# Patient Record
Sex: Female | Born: 1960 | Race: White | Hispanic: No | Marital: Married | Smoking: Never smoker
Health system: Southern US, Community
[De-identification: ages and names within clinical notes are randomized; demographics above are authoritative.]

## PROBLEM LIST (undated history)

## (undated) DIAGNOSIS — E063 Autoimmune thyroiditis: Secondary | ICD-10-CM

## (undated) DIAGNOSIS — Z972 Presence of dental prosthetic device (complete) (partial): Secondary | ICD-10-CM

## (undated) DIAGNOSIS — F32A Depression, unspecified: Secondary | ICD-10-CM

## (undated) DIAGNOSIS — G43909 Migraine, unspecified, not intractable, without status migrainosus: Secondary | ICD-10-CM

## (undated) DIAGNOSIS — E559 Vitamin D deficiency, unspecified: Secondary | ICD-10-CM

## (undated) DIAGNOSIS — N182 Chronic kidney disease, stage 2 (mild): Secondary | ICD-10-CM

## (undated) DIAGNOSIS — E785 Hyperlipidemia, unspecified: Secondary | ICD-10-CM

## (undated) DIAGNOSIS — R112 Nausea with vomiting, unspecified: Secondary | ICD-10-CM

## (undated) DIAGNOSIS — Z9889 Other specified postprocedural states: Secondary | ICD-10-CM

## (undated) DIAGNOSIS — E669 Obesity, unspecified: Secondary | ICD-10-CM

## (undated) DIAGNOSIS — T7840XA Allergy, unspecified, initial encounter: Secondary | ICD-10-CM

## (undated) DIAGNOSIS — F329 Major depressive disorder, single episode, unspecified: Secondary | ICD-10-CM

## (undated) HISTORY — DX: Vitamin D deficiency, unspecified: E55.9

## (undated) HISTORY — DX: Obesity, unspecified: E66.9

## (undated) HISTORY — DX: Allergy, unspecified, initial encounter: T78.40XA

## (undated) HISTORY — DX: Chronic kidney disease, stage 2 (mild): N18.2

## (undated) HISTORY — DX: Hyperlipidemia, unspecified: E78.5

## (undated) HISTORY — DX: Depression, unspecified: F32.A

## (undated) HISTORY — DX: Autoimmune thyroiditis: E06.3

## (undated) HISTORY — DX: Migraine, unspecified, not intractable, without status migrainosus: G43.909

## (undated) HISTORY — DX: Major depressive disorder, single episode, unspecified: F32.9

---

## 2004-05-26 ENCOUNTER — Emergency Department: Payer: Self-pay | Admitting: Emergency Medicine

## 2004-05-26 ENCOUNTER — Other Ambulatory Visit: Payer: Self-pay

## 2004-09-16 ENCOUNTER — Ambulatory Visit: Payer: Self-pay | Admitting: Obstetrics and Gynecology

## 2005-09-25 HISTORY — PX: ABDOMINAL HYSTERECTOMY: SHX81

## 2005-09-26 ENCOUNTER — Ambulatory Visit: Payer: Self-pay | Admitting: Obstetrics and Gynecology

## 2005-11-17 ENCOUNTER — Ambulatory Visit: Payer: Self-pay | Admitting: Obstetrics and Gynecology

## 2010-07-15 ENCOUNTER — Ambulatory Visit: Payer: Self-pay | Admitting: Family Medicine

## 2010-07-26 ENCOUNTER — Ambulatory Visit: Payer: Self-pay | Admitting: Family Medicine

## 2011-09-26 ENCOUNTER — Ambulatory Visit: Payer: Self-pay | Admitting: Family Medicine

## 2013-08-12 LAB — HM PAP SMEAR: HM Pap smear: NEGATIVE

## 2013-10-04 ENCOUNTER — Ambulatory Visit: Payer: Self-pay | Admitting: Family Medicine

## 2013-10-04 LAB — HM MAMMOGRAPHY

## 2014-08-26 DIAGNOSIS — E559 Vitamin D deficiency, unspecified: Secondary | ICD-10-CM

## 2014-08-26 HISTORY — DX: Vitamin D deficiency, unspecified: E55.9

## 2015-01-08 ENCOUNTER — Other Ambulatory Visit: Payer: Self-pay | Admitting: Family Medicine

## 2015-01-08 MED ORDER — BUPROPION HCL ER (SR) 100 MG PO TB12: 100.0000 mg | ORAL_TABLET | Freq: Two times a day (BID) | ORAL | Status: DC

## 2015-01-08 NOTE — Telephone Encounter (Signed)
Pt called stated she needs a refill on the following:  Wellbutrin  Pharm: [facility]   Thanks.

## 2015-01-08 NOTE — Telephone Encounter (Signed)
Routing to provider  

## 2015-03-05 ENCOUNTER — Other Ambulatory Visit: Payer: Self-pay | Admitting: Family Medicine

## 2015-03-06 ENCOUNTER — Other Ambulatory Visit: Payer: Self-pay

## 2015-03-06 MED ORDER — BUPROPION HCL ER (SR) 100 MG PO TB12: 100.0000 mg | ORAL_TABLET | Freq: Two times a day (BID) | ORAL | Status: DC

## 2015-03-06 NOTE — Telephone Encounter (Signed)
She needs a new rx for Wellbutrin, has a new mail order company.

## 2015-03-06 NOTE — Telephone Encounter (Signed)
Needs new rxs for new mail order pharmacy.

## 2015-03-07 MED ORDER — SERTRALINE HCL 100 MG PO TABS: 100.0000 mg | ORAL_TABLET | Freq: Every day | ORAL | Status: DC

## 2015-03-07 MED ORDER — LISINOPRIL 2.5 MG PO TABS: 2.5000 mg | ORAL_TABLET | Freq: Every day | ORAL | Status: DC

## 2015-04-09 DIAGNOSIS — N183 Chronic kidney disease, stage 3 unspecified: Secondary | ICD-10-CM | POA: Insufficient documentation

## 2015-04-09 DIAGNOSIS — E559 Vitamin D deficiency, unspecified: Secondary | ICD-10-CM | POA: Insufficient documentation

## 2015-04-09 DIAGNOSIS — N182 Chronic kidney disease, stage 2 (mild): Secondary | ICD-10-CM | POA: Insufficient documentation

## 2015-04-09 DIAGNOSIS — F329 Major depressive disorder, single episode, unspecified: Secondary | ICD-10-CM | POA: Insufficient documentation

## 2015-04-09 DIAGNOSIS — G43909 Migraine, unspecified, not intractable, without status migrainosus: Secondary | ICD-10-CM | POA: Insufficient documentation

## 2015-04-09 DIAGNOSIS — F39 Unspecified mood [affective] disorder: Secondary | ICD-10-CM | POA: Insufficient documentation

## 2015-04-09 DIAGNOSIS — F32A Depression, unspecified: Secondary | ICD-10-CM | POA: Insufficient documentation

## 2015-04-09 DIAGNOSIS — E785 Hyperlipidemia, unspecified: Secondary | ICD-10-CM | POA: Insufficient documentation

## 2015-04-09 DIAGNOSIS — E063 Autoimmune thyroiditis: Secondary | ICD-10-CM | POA: Insufficient documentation

## 2015-04-16 ENCOUNTER — Ambulatory Visit (INDEPENDENT_AMBULATORY_CARE_PROVIDER_SITE_OTHER): Payer: 59 | Admitting: Family Medicine

## 2015-04-16 ENCOUNTER — Encounter: Payer: Self-pay | Admitting: Family Medicine

## 2015-04-16 VITALS — BP 113/71 | HR 79 | Temp 98.5°F | Ht 65.25 in | Wt 166.2 lb

## 2015-04-16 DIAGNOSIS — N182 Chronic kidney disease, stage 2 (mild): Secondary | ICD-10-CM

## 2015-04-16 DIAGNOSIS — L821 Other seborrheic keratosis: Secondary | ICD-10-CM | POA: Insufficient documentation

## 2015-04-16 DIAGNOSIS — E785 Hyperlipidemia, unspecified: Secondary | ICD-10-CM

## 2015-04-16 DIAGNOSIS — E559 Vitamin D deficiency, unspecified: Secondary | ICD-10-CM | POA: Diagnosis not present

## 2015-04-16 DIAGNOSIS — F32A Depression, unspecified: Secondary | ICD-10-CM

## 2015-04-16 DIAGNOSIS — F329 Major depressive disorder, single episode, unspecified: Secondary | ICD-10-CM | POA: Diagnosis not present

## 2015-04-16 DIAGNOSIS — G43109 Migraine with aura, not intractable, without status migrainosus: Secondary | ICD-10-CM

## 2015-04-16 DIAGNOSIS — E063 Autoimmune thyroiditis: Secondary | ICD-10-CM

## 2015-04-16 NOTE — Progress Notes (Signed)
BP 113/71 mmHg  Pulse 79  Temp(Src) 98.5 F (36.9 C)  Ht 5' 5.25" (1.657 m)  Wt 166 lb 3.2 oz (75.388 kg)  BMI 27.46 kg/m2  SpO2 98%   Subjective:    Patient ID: Patty Young, female    DOB: Jul 18, 1960, 54 y.o.   MRN: [id number]  HPI: Patty Young is a 54 y.o. female  Chief Complaint  Patient presents with  . Follow-up    6 month follow up. Garment/textile technologist. [id number]   She had high cholesterol; she was not aware of this and we reviewed old labs; she has been working on weight loss and healthier eating; eats 2-3 eggs a week; occasional processed pork, not every day; does not drink milk; previous LDL when heavier was 155, and she brought this down to 118 on her own without medicine in March of 2016  She has hashimoto's; has lost weight on purpose; years ago, she was seen by Dr. Dossie Arbour, had abnormal labs, then went to see endocrinologist, had radiation (?) and thyroid testing, then was told she had nothing else wrong and the endocrinologist didn't know why she was there; this all happened years ago and she has been fine since; she denies loose stools or constipation; she has obesity, but is working on losing weight on purpose; Garment/textile technologist reviewed; she was 184 pounds last August, then 170 pounds, now down to 166 pounds; no set goal  She has CKD stage 2; GFR over 60 (69 last time) and has been relatively stable since 2010; she now takes the ACE-I, low dose  Migraines; one imitrex this month; gets the aura  Mood is doing well; she wants to stay on the same medicine; denies SI/HI  She has low vitamin D; she took the Rx for two months; does not spend much time outside at all; sun does like her, face breaks out, gets fever blisters and ulcers really bad; check that today  Past Medical History  Diagnosis Date  . Depression   . Hyperlipidemia   . Migraines   . CKD (chronic kidney disease) stage 2, GFR 60-89 ml/min   . Obesity   . Hashimoto's disease   . Vitamin D deficiency  disease March 2016    11.7   Past Surgical History  Procedure Laterality Date  . Abdominal hysterectomy  09/2005    still has ovaries and cervix, needs paps   Relevant past medical, surgical, family and social history reviewed and updated as indicated. Interim medical history since our last visit reviewed. Allergies and medications reviewed and updated.  Review of Systems Per HPI unless specifically indicated above     Objective:    BP 113/71 mmHg  Pulse 79  Temp(Src) 98.5 F (36.9 C)  Ht 5' 5.25" (1.657 m)  Wt 166 lb 3.2 oz (75.388 kg)  BMI 27.46 kg/m2  SpO2 98%  Wt Readings from Last 3 Encounters:  04/16/15 166 lb 3.2 oz (75.388 kg)  09/05/14 170 lb (77.111 kg)    Physical Exam  Constitutional: She appears well-developed and well-nourished. No distress.  Weight down 4 pounds over last 7 months  HENT:  Head: Normocephalic and atraumatic.  Eyes: EOM are normal. No scleral icterus.  Neck: No thyromegaly present.  Cardiovascular: Normal rate, regular rhythm and normal heart sounds.   No murmur heard. Pulmonary/Chest: Effort normal and breath sounds normal. No respiratory distress. She has no wheezes.  Abdominal: Soft. Bowel sounds are normal. She exhibits no distension.  Musculoskeletal: Normal range  of motion. She exhibits no edema.  Neurological: She is alert. She exhibits normal muscle tone.  Skin: Skin is warm and dry. She is not diaphoretic. No pallor.  Light brown nummular lesion, very slightly papular, keratotic on the posterior left calf; few other similar lesions on legs; tiny cherry angioma on the left side chest  Psychiatric: She has a normal mood and affect. Her behavior is normal. Judgment and thought content normal.    Results for orders placed or performed in visit on 04/09/15  HM MAMMOGRAPHY  Result Value Ref Range   HM Mammogram per PP   HM PAP SMEAR  Result Value Ref Range   HM Pap smear normal,HPV Negative       Assessment & Plan:   Problem  List Items Addressed This Visit      Cardiovascular and Mediastinum   Migraines    Avoid triggers; use of triptan occasionally      Relevant Medications   SUMAtriptan (IMITREX) 100 MG tablet     Endocrine   Hashimoto's disease    Reviewed TSH back to 2006, saw specialist, asymptomatic; check TSH yearly or if new sx        Musculoskeletal and Integument   Seborrheic keratoses    Explained benign lesion on the posterior left calf        Genitourinary   CKD (chronic kidney disease) stage 2, GFR 60-89 ml/min - Primary    Check on ACE-I, expect number to stay stable or improve; avoid NSAIDs and stay hydrated      Relevant Orders   Basic metabolic panel     Other   Depression    Continue medicine; no SI/HI      Hyperlipidemia    This has resolved with patient watching her diet and losing weight; check once a year      Vitamin D deficiency disease    I was going to check vit D level but it costs $200 and we'll just have her take the OTC vitamin D3         Follow up plan: Return in about 6 months (around 10/15/2015) for follow-up, fasting labs; physical and pap when due.  An after-visit summary was printed and given to the patient at check-out.  Please see the patient instructions which may contain other information and recommendations beyond what is mentioned above in the assessment and plan. Orders Placed This Encounter  Procedures  . Basic metabolic panel   Face-to-face time with patient was more than 25 minutes, >50% time spent counseling and coordination of care

## 2015-04-16 NOTE — Assessment & Plan Note (Signed)
Avoid triggers; use of triptan occasionally

## 2015-04-16 NOTE — Patient Instructions (Addendum)
Please contact your health insurance carrier about preventive services and if they will not cover things like flu shots and mammograms, please report that or give me a call If you need something for aches or pains, try to use Tylenol (acetaminophen) instead of non-steroidals (which include Aleve, ibuprofen, Advil, Motrin, and naproxen); non-steroidals can cause long-term kidney damage Take 2,000 iu of vitamin D3 daily I do recommend yearly flu shots; for individuals who don't want flu shots, try to practice excellent hand hygiene, and avoid nursing homes, day cares, and hospitals during peak flu season; taking 1000 mg of vitamin C daily during flu/cold season may help boost your immune system too Return in 6 months and come fasting for cholesterol, etc., and return for your physical and pap smear when due

## 2015-04-16 NOTE — Assessment & Plan Note (Signed)
Explained benign lesion on the posterior left calf

## 2015-04-16 NOTE — Assessment & Plan Note (Signed)
Reviewed TSH back to 2006, saw specialist, asymptomatic; check TSH yearly or if new sx

## 2015-04-16 NOTE — Assessment & Plan Note (Signed)
Continue medicine; no SI/HI

## 2015-04-16 NOTE — Assessment & Plan Note (Signed)
Check on ACE-I, expect number to stay stable or improve; avoid NSAIDs and stay hydrated

## 2015-04-16 NOTE — Assessment & Plan Note (Addendum)
I was going to check vit D level but it costs $200 and we'll just have her take the OTC vitamin D3

## 2015-04-16 NOTE — Assessment & Plan Note (Signed)
This has resolved with patient watching her diet and losing weight; check once a year

## 2015-04-17 ENCOUNTER — Encounter: Payer: Self-pay | Admitting: Family Medicine

## 2015-04-17 LAB — BASIC METABOLIC PANEL
BUN / CREAT RATIO: 12 (ref 9–23)
BUN: 11 mg/dL (ref 6–24)
CALCIUM: 9.2 mg/dL (ref 8.7–10.2)
CHLORIDE: 99 mmol/L (ref 97–106)
CO2: 26 mmol/L (ref 18–29)
Creatinine, Ser: 0.89 mg/dL (ref 0.57–1.00)
GFR calc Af Amer: 86 mL/min/{1.73_m2} (ref >59)
GFR calc non Af Amer: 75 mL/min/{1.73_m2} (ref >59)
GLUCOSE: 77 mg/dL (ref 65–99)
Potassium: 4.3 mmol/L (ref 3.5–5.2)
Sodium: 140 mmol/L (ref 136–144)

## 2015-06-17 ENCOUNTER — Encounter: Payer: Self-pay | Admitting: Family Medicine

## 2015-06-18 MED ORDER — VALACYCLOVIR HCL 1 G PO TABS: ORAL_TABLET | ORAL | Status: DC

## 2015-09-07 ENCOUNTER — Encounter: Payer: Self-pay | Admitting: Family Medicine

## 2015-09-07 ENCOUNTER — Ambulatory Visit (INDEPENDENT_AMBULATORY_CARE_PROVIDER_SITE_OTHER): Payer: 59 | Admitting: Family Medicine

## 2015-09-07 VITALS — BP 110/78 | HR 81 | Temp 97.7°F | Ht 66.0 in

## 2015-09-07 DIAGNOSIS — N182 Chronic kidney disease, stage 2 (mild): Secondary | ICD-10-CM

## 2015-09-07 DIAGNOSIS — E785 Hyperlipidemia, unspecified: Secondary | ICD-10-CM

## 2015-09-07 DIAGNOSIS — F32A Depression, unspecified: Secondary | ICD-10-CM

## 2015-09-07 DIAGNOSIS — G43109 Migraine with aura, not intractable, without status migrainosus: Secondary | ICD-10-CM

## 2015-09-07 DIAGNOSIS — E063 Autoimmune thyroiditis: Secondary | ICD-10-CM

## 2015-09-07 DIAGNOSIS — F329 Major depressive disorder, single episode, unspecified: Secondary | ICD-10-CM | POA: Diagnosis not present

## 2015-09-07 NOTE — Assessment & Plan Note (Signed)
Asymptomatic. Continue sertraline and bupropion.

## 2015-09-07 NOTE — Progress Notes (Signed)
Patient ID: Patty Young, female   DOB: Feb 02, 1961, 55 y.o.   MRN: [id number]  Patty AlarEric Clide Remmers, MD Phone: [phone]  Patty Young is a 55 y.o. female who presents today for new patient visit.  Patient presents to establish care. She reports no complaints today. We have reviewed her past medical history. She has been advised she had hyperlipidemia past, though worked on her diet by cutting out sugars and lost 20 pounds. On review of her last office visit note at her prior PCPs office it appears that this has returned into the normal range. Chronic kidney disease stage II, patient reports that she was advised that she had kidney issues and she thinks this was around the time that she was using a significant amount of ibuprofen for headaches. She's been taking lisinopril 2.5 mg daily. Her last GFR was 75. Creatinine was 0.89. She uses minimal NSAIDs at this time. Depression, patient reports being stable on sertraline and bupropion. No issues with depression or anxiety at this time. No SI or HI. Migraine headaches, patient notes having these for most of her life. They started in the back on one side and come up to the front. There is associated photophobia and phonophobia. She's been taking Imitrex 0-2 times per month for headaches. Headaches have not changed in some time. Last headache was several weeks ago. Denies numbness, weakness, and vision changes. Does note a family history of migraine headaches. Hashimoto's, reports a history of this in the past. Was evaluated by endocrinology and was told that her thyroid was normal. She denies symptoms related to her thyroid. No temperature intolerance, weight gain or weight loss, palpitations, or sweatiness.  Active Ambulatory Problems    Diagnosis Date Noted  . Depression   . Hyperlipidemia   . Migraines   . CKD (chronic kidney disease) stage 2, GFR 60-89 ml/min   . Hashimoto's disease   . Vitamin D deficiency disease   . Seborrheic keratoses  04/16/2015   Resolved Ambulatory Problems    Diagnosis Date Noted  . No Resolved Ambulatory Problems   Past Medical History  Diagnosis Date  . Obesity     Family History  Problem Relation Age of Onset  . COPD Mother   . Hypertension Mother   . Osteoporosis Mother   . Hypertension Father   . Hyperlipidemia Brother     Social History   Social History  . Marital Status: Married    Spouse Name: N/A  . Number of Children: N/A  . Years of Education: N/A   Occupational History  . Not on file.   Social History Main Topics  . Smoking status: Never Smoker   . Smokeless tobacco: Never Used  . Alcohol Use: No  . Drug Use: No  . Sexual Activity: Not on file   Other Topics Concern  . Not on file   Social History Narrative    ROS  General:  Negative for nexplained weight loss, fever Skin: Negative for new or changing mole, sore that won't heal HEENT: Negative for trouble hearing, trouble seeing, ringing in ears, mouth sores, hoarseness, change in voice, dysphagia. CV:  Negative for chest pain, dyspnea, edema, palpitations Resp: Negative for cough, dyspnea, hemoptysis GI: Negative for nausea, vomiting, diarrhea, constipation, abdominal pain, melena, hematochezia. GU: Negative for dysuria, incontinence, urinary hesitance, hematuria, vaginal or penile discharge, polyuria, sexual difficulty, lumps in testicle or breasts MSK: Negative for muscle cramps or aches, joint pain or swelling Neuro: Negative for headaches, weakness, numbness, dizziness,  passing out/fainting Psych: Negative for depression, anxiety, memory problems  Objective  Physical Exam Filed Vitals:   09/07/15 0816  BP: 110/78  Pulse: 81  Temp: 97.7 F (36.5 C)    BP Readings from Last 3 Encounters:  09/07/15 110/78  04/16/15 113/71  09/05/14 118/74   Wt Readings from Last 3 Encounters:  04/16/15 166 lb 3.2 oz (75.388 kg)  09/05/14 170 lb (77.111 kg)    Physical Exam  Constitutional: She is  well-developed, well-nourished, and in no distress.  HENT:  Head: Normocephalic and atraumatic.  Right Ear: External ear normal.  Left Ear: External ear normal.  Mouth/Throat: Oropharynx is clear and moist. No oropharyngeal exudate.  Eyes: Conjunctivae are normal. Pupils are equal, round, and reactive to light.  Neck: Neck supple.  Cardiovascular: Normal rate, regular rhythm and normal heart sounds.  Exam reveals no gallop and no friction rub.   No murmur heard. Pulmonary/Chest: Effort normal and breath sounds normal. No respiratory distress. She has no wheezes. She has no rales.  Abdominal: Soft. Bowel sounds are normal. She exhibits no distension. There is no tenderness. There is no rebound and no guarding.  Musculoskeletal: She exhibits no edema.  Lymphadenopathy:    She has no cervical adenopathy.  Neurological: She is alert. Gait normal.  Moves all extremities equally  Skin: Skin is warm and dry. She is not diaphoretic.  Psychiatric: Mood and affect normal.     Assessment/Plan:   CKD (chronic kidney disease) stage 2, GFR 60-89 ml/min BMP checked 5 months ago. Limit NSAID use. We'll continue to monitor and recheck in a month.  Depression Asymptomatic. Continue sertraline and bupropion.   Hashimoto's disease Asymptomatic. We'll recheck a TSH in one month.  Hyperlipidemia Reports this issue is resolved with diet and exercise. We'll recheck a lipid panel at her next office visit.  Migraines Stable. Asymptomatic at this time. Continue Imitrex as needed. Given return precautions.   Patty Alar, MD [facility]

## 2015-09-07 NOTE — Progress Notes (Signed)
Pre visit review using our clinic review tool, if applicable. No additional management support is needed unless otherwise documented below in the visit note. 

## 2015-09-07 NOTE — Assessment & Plan Note (Signed)
Stable. Asymptomatic at this time. Continue Imitrex as needed. Given return precautions.

## 2015-09-07 NOTE — Assessment & Plan Note (Signed)
Asymptomatic. We'll recheck a TSH in one month.

## 2015-09-07 NOTE — Assessment & Plan Note (Signed)
BMP checked 5 months ago. Limit NSAID use. We'll continue to monitor and recheck in a month.

## 2015-09-07 NOTE — Assessment & Plan Note (Signed)
Reports this issue is resolved with diet and exercise. We'll recheck a lipid panel at her next office visit.

## 2015-09-07 NOTE — Patient Instructions (Signed)
Nice to meet you. We will request records from your prior PCP. Please continue your current medication regimen. We will see back in a month for lab work.

## 2015-10-12 ENCOUNTER — Ambulatory Visit (INDEPENDENT_AMBULATORY_CARE_PROVIDER_SITE_OTHER): Payer: 59 | Admitting: Family Medicine

## 2015-10-12 ENCOUNTER — Encounter: Payer: Self-pay | Admitting: Family Medicine

## 2015-10-12 VITALS — BP 114/76 | HR 84 | Temp 98.3°F | Ht 66.0 in | Wt 172.6 lb

## 2015-10-12 DIAGNOSIS — Z1239 Encounter for other screening for malignant neoplasm of breast: Secondary | ICD-10-CM

## 2015-10-12 DIAGNOSIS — N182 Chronic kidney disease, stage 2 (mild): Secondary | ICD-10-CM | POA: Diagnosis not present

## 2015-10-12 DIAGNOSIS — Z8639 Personal history of other endocrine, nutritional and metabolic disease: Secondary | ICD-10-CM | POA: Diagnosis not present

## 2015-10-12 DIAGNOSIS — E785 Hyperlipidemia, unspecified: Secondary | ICD-10-CM | POA: Diagnosis not present

## 2015-10-12 DIAGNOSIS — E063 Autoimmune thyroiditis: Secondary | ICD-10-CM

## 2015-10-12 LAB — COMPREHENSIVE METABOLIC PANEL
ALK PHOS: 58 U/L (ref 39–117)
ALT: 16 U/L (ref 0–35)
AST: 16 U/L (ref 0–37)
Albumin: 4.3 g/dL (ref 3.5–5.2)
BUN: 16 mg/dL (ref 6–23)
CO2: 26 mEq/L (ref 19–32)
Calcium: 9.6 mg/dL (ref 8.4–10.5)
Chloride: 102 mEq/L (ref 96–112)
Creatinine, Ser: 1.01 mg/dL (ref 0.40–1.20)
GFR: 61.02 mL/min (ref >60.00)
Glucose, Bld: 79 mg/dL (ref 70–99)
POTASSIUM: 4.7 meq/L (ref 3.5–5.1)
SODIUM: 138 meq/L (ref 135–145)
TOTAL PROTEIN: 7.6 g/dL (ref 6.0–8.3)
Total Bilirubin: 0.4 mg/dL (ref 0.2–1.2)

## 2015-10-12 LAB — LIPID PANEL
Cholesterol: 219 mg/dL — ABNORMAL HIGH (ref 0–200)
HDL: 62.6 mg/dL (ref >39.00)
LDL CALC: 144 mg/dL — AB (ref 0–99)
NONHDL: 156.52
Total CHOL/HDL Ratio: 4
Triglycerides: 61 mg/dL (ref 0.0–149.0)
VLDL: 12.2 mg/dL (ref 0.0–40.0)

## 2015-10-12 LAB — TSH: TSH: 2.36 u[IU]/mL (ref 0.35–4.50)

## 2015-10-12 NOTE — Progress Notes (Signed)
Pre visit review using our clinic review tool, if applicable. No additional management support is needed unless otherwise documented below in the visit note. 

## 2015-10-12 NOTE — Progress Notes (Signed)
Patient ID: Lezlie Octave, female   DOB: 1960/09/11, 55 y.o.   MRN: [id number]  Tommi Rumps, MD Phone: [phone]  Patty Young is a 55 y.o. female who presents today for follow-up.  Hashimoto's thyroiditis: Patient is a history of this in the past. Notes her recent thyroid test has been normal. No temperature intolerance, weight changes, palpitations, or sweatiness.  CKD: Patient notes she takes lisinopril 2.5 mg daily for this. Notes her creatinine has been up and down in the past and this was felt to be related to NSAID use. Takes NSAIDs infrequently now.  HYPERLIPIDEMIA Symptoms Chest pain on exertion:  No   Leg claudication:   No Diet-controlled. Has cut out sugars. Still eats fried fatty foods. Does not eat out frequently. Has not been exercising.  PMH: nonsmoker.   ROS see history of present illness  Objective  Physical Exam Filed Vitals:   10/12/15 0758  BP: 114/76  Pulse: 84  Temp: 98.3 F (36.8 C)    BP Readings from Last 3 Encounters:  10/12/15 114/76  09/07/15 110/78  04/16/15 113/71   Wt Readings from Last 3 Encounters:  10/12/15 172 lb 9.6 oz (78.291 kg)  04/16/15 166 lb 3.2 oz (75.388 kg)  09/05/14 170 lb (77.111 kg)    Physical Exam  Constitutional: She is well-developed, well-nourished, and in no distress.  HENT:  Head: Normocephalic and atraumatic.  Neck: Neck supple. No thyromegaly present.  Cardiovascular: Normal rate, regular rhythm and normal heart sounds.   Pulmonary/Chest: Effort normal and breath sounds normal.  Neurological: She is alert. Gait normal.  Skin: Skin is warm and dry. She is not diaphoretic.     Assessment/Plan: Please see individual problem list.  CKD (chronic kidney disease) stage 2, GFR 60-89 ml/min Check CMP today. Avoid NSAIDs. Continue lisinopril.  Hyperlipidemia Asymptomatic. Check lipid panel today.  Hashimoto's disease Asymptomatic. Check TSH.    Orders Placed This Encounter  Procedures  . MM  Digital Screening    Standing Status: Future     Number of Occurrences:      Standing Expiration Date: 12/11/2016    Order Specific Question:  Reason for Exam (SYMPTOM  OR DIAGNOSIS REQUIRED)    Answer:  breast cancer screening    Order Specific Question:  Is the patient pregnant?    Answer:  No    Order Specific Question:  Preferred imaging location?    Answer:  [facility]  . TSH  . Comp Met (CMET)  . Lipid Profile    # Healthcare maintenance: Mammogram ordered. Patient advised to check with insurance company to see if they cover hepatitis C and HIV screening. She was also given stool cards given that she reports they do not cover colonoscopy.   Tommi Rumps, MD [facility]

## 2015-10-12 NOTE — Assessment & Plan Note (Signed)
Asymptomatic.  Check TSH

## 2015-10-12 NOTE — Patient Instructions (Signed)
Nice to see you. We will check lab work today. Please check with your insurance to make sure they cover the stool cards, hepatitis C screening, and HIV screening.

## 2015-10-12 NOTE — Assessment & Plan Note (Signed)
Asymptomatic. Check lipid panel today.

## 2015-10-12 NOTE — Assessment & Plan Note (Signed)
Check CMP today. Avoid NSAIDs. Continue lisinopril.

## 2015-10-13 ENCOUNTER — Ambulatory Visit: Payer: 59 | Admitting: Family Medicine

## 2015-10-14 ENCOUNTER — Ambulatory Visit: Payer: 59 | Admitting: Family Medicine

## 2015-10-14 ENCOUNTER — Encounter: Payer: Self-pay | Admitting: Family Medicine

## 2015-10-15 ENCOUNTER — Ambulatory Visit: Payer: 59 | Admitting: Family Medicine

## 2015-10-20 ENCOUNTER — Encounter: Payer: Self-pay | Admitting: Family Medicine

## 2015-10-30 ENCOUNTER — Ambulatory Visit
Admission: RE | Admit: 2015-10-30 | Discharge: 2015-10-30 | Disposition: A | Discharge status: Home / Self Care | Payer: 59 | Source: Ambulatory Visit | Attending: Family Medicine | Admitting: Family Medicine

## 2015-10-30 DIAGNOSIS — Z1239 Encounter for other screening for malignant neoplasm of breast: Secondary | ICD-10-CM

## 2015-10-30 DIAGNOSIS — Z1231 Encounter for screening mammogram for malignant neoplasm of breast: Secondary | ICD-10-CM | POA: Insufficient documentation

## 2015-11-09 ENCOUNTER — Ambulatory Visit: Payer: 59

## 2016-04-14 ENCOUNTER — Ambulatory Visit (INDEPENDENT_AMBULATORY_CARE_PROVIDER_SITE_OTHER): Payer: 59 | Admitting: Family Medicine

## 2016-04-14 ENCOUNTER — Encounter: Payer: Self-pay | Admitting: Family Medicine

## 2016-04-14 VITALS — BP 130/70 | HR 96 | Temp 98.7°F | Wt 175.5 lb

## 2016-04-14 DIAGNOSIS — E785 Hyperlipidemia, unspecified: Secondary | ICD-10-CM

## 2016-04-14 DIAGNOSIS — N182 Chronic kidney disease, stage 2 (mild): Secondary | ICD-10-CM | POA: Diagnosis not present

## 2016-04-14 DIAGNOSIS — E063 Autoimmune thyroiditis: Secondary | ICD-10-CM | POA: Diagnosis not present

## 2016-04-14 DIAGNOSIS — F325 Major depressive disorder, single episode, in full remission: Secondary | ICD-10-CM | POA: Diagnosis not present

## 2016-04-14 DIAGNOSIS — Z23 Encounter for immunization: Secondary | ICD-10-CM | POA: Diagnosis not present

## 2016-04-14 LAB — MICROALBUMIN / CREATININE URINE RATIO
Creatinine,U: 38.6 mg/dL
Microalb Creat Ratio: 1.8 mg/g (ref 0.0–30.0)
Microalb, Ur: <0.7 mg/dL (ref 0.0–1.9)

## 2016-04-14 LAB — BASIC METABOLIC PANEL
BUN: 14 mg/dL (ref 6–23)
CHLORIDE: 103 meq/L (ref 96–112)
CO2: 30 mEq/L (ref 19–32)
Calcium: 9.4 mg/dL (ref 8.4–10.5)
Creatinine, Ser: 1.03 mg/dL (ref 0.40–1.20)
GFR: 59.53 mL/min — AB (ref >60.00)
GLUCOSE: 76 mg/dL (ref 70–99)
POTASSIUM: 3.8 meq/L (ref 3.5–5.1)
Sodium: 141 mEq/L (ref 135–145)

## 2016-04-14 LAB — TSH: TSH: 3 u[IU]/mL (ref 0.35–4.50)

## 2016-04-14 LAB — LDL CHOLESTEROL, DIRECT: Direct LDL: 148 mg/dL

## 2016-04-14 NOTE — Patient Instructions (Signed)
Nice to see you. Please continue your current medications. We will check some lab work and call you with the results.

## 2016-04-14 NOTE — Assessment & Plan Note (Signed)
Asymptomatic. Continue wellbutrin and zoloft.

## 2016-04-14 NOTE — Assessment & Plan Note (Signed)
Check BMET and urine microalbumin.

## 2016-04-14 NOTE — Progress Notes (Signed)
  Marikay AlarEric Theda Payer, MD Phone: [phone]  Patty Young is a 55 y.o. female who presents today for f/u.  HYPERLIPIDEMIA Symptoms Chest pain on exertion:  No   Leg claudication:   No Notes her diet is unchanged. She's not drinking soft drinks. Notes it is a typical diet. She has not started exercise.  Depression: Patient notes no symptoms at this time. Stable on Wellbutrin and Zoloft. No SI. No anxiety.  CKD stage II. Has stopped taking her lisinopril. She's not taking any ibuprofen. She thinks she was tested for protein in her urine previously though is unsure.  Hashimoto's: Patient notes she had this relating to her thyroid in the past. Notes her thyroid testing has been normal recently. No skin changes. No heat or cold intolerance. No significant weight gain.   PMH: nonsmoker.   ROS see history of present illness  Objective  Physical Exam Vitals:   04/14/16 0853  BP: 130/70  Pulse: 96  Temp: 98.7 F (37.1 C)    BP Readings from Last 3 Encounters:  04/14/16 130/70  10/12/15 114/76  09/07/15 110/78   Wt Readings from Last 3 Encounters:  04/14/16 175 lb 8 oz (79.6 kg)  10/12/15 172 lb 9.6 oz (78.3 kg)  04/16/15 166 lb 3.2 oz (75.4 kg)    Physical Exam  Constitutional: No distress.  HENT:  Head: Normocephalic and atraumatic.  Cardiovascular: Normal rate, regular rhythm and normal heart sounds.   Pulmonary/Chest: Effort normal and breath sounds normal.  Musculoskeletal: She exhibits no edema.  Neurological: She is alert. Gait normal.  Skin: Skin is warm and dry. She is not diaphoretic.  Psychiatric: Mood and affect normal.     Assessment/Plan: Please see individual problem list.  Hashimoto's disease Asymptomatic. TSH check today.  CKD (chronic kidney disease) stage 2, GFR 60-89 ml/min Check BMET and urine microalbumin.  Depression Asymptomatic. Continue wellbutrin and zoloft.  Hyperlipidemia Asymptomatic. Check direct LDL today.   Orders Placed  This Encounter  Procedures  . Direct LDL  . Basic Metabolic Panel (BMET)  . Urine Microalbumin w/creat. ratio  . TSH    Marikay AlarEric Eason Housman, MD [facility]

## 2016-04-14 NOTE — Assessment & Plan Note (Signed)
Asymptomatic. Check direct LDL today.

## 2016-04-14 NOTE — Assessment & Plan Note (Signed)
Asymptomatic. TSH check today.

## 2016-04-14 NOTE — Progress Notes (Signed)
Pre visit review using our clinic review tool, if applicable. No additional management support is needed unless otherwise documented below in the visit note. 

## 2016-05-04 ENCOUNTER — Encounter: Payer: Self-pay | Admitting: Family Medicine

## 2016-05-04 MED ORDER — SERTRALINE HCL 100 MG PO TABS: 100.0000 mg | ORAL_TABLET | Freq: Every day | ORAL | 3 refills | Status: DC

## 2016-07-26 ENCOUNTER — Encounter: Payer: Self-pay | Admitting: Family Medicine

## 2016-07-27 MED ORDER — VALACYCLOVIR HCL 1 G PO TABS: ORAL_TABLET | ORAL | 3 refills | Status: DC

## 2016-07-27 NOTE — Telephone Encounter (Signed)
Last OV 04/14/16 last filled by Dr Sherie DonLada 06/18/15 12 3rf

## 2016-09-05 ENCOUNTER — Other Ambulatory Visit: Payer: Self-pay | Admitting: Family Medicine

## 2016-09-05 ENCOUNTER — Encounter: Payer: Self-pay | Admitting: Family Medicine

## 2016-09-05 MED ORDER — BUPROPION HCL ER (SR) 100 MG PO TB12: 100.0000 mg | ORAL_TABLET | Freq: Two times a day (BID) | ORAL | 1 refills | Status: DC

## 2016-10-13 ENCOUNTER — Encounter: Payer: Self-pay | Admitting: Family Medicine

## 2016-10-13 ENCOUNTER — Ambulatory Visit (INDEPENDENT_AMBULATORY_CARE_PROVIDER_SITE_OTHER): Payer: 59 | Admitting: Family Medicine

## 2016-10-13 VITALS — BP 124/80 | HR 69 | Temp 97.9°F | Wt 179.6 lb

## 2016-10-13 DIAGNOSIS — E785 Hyperlipidemia, unspecified: Secondary | ICD-10-CM

## 2016-10-13 DIAGNOSIS — R232 Flushing: Secondary | ICD-10-CM | POA: Insufficient documentation

## 2016-10-13 DIAGNOSIS — N182 Chronic kidney disease, stage 2 (mild): Secondary | ICD-10-CM

## 2016-10-13 DIAGNOSIS — F325 Major depressive disorder, single episode, in full remission: Secondary | ICD-10-CM | POA: Diagnosis not present

## 2016-10-13 LAB — COMPREHENSIVE METABOLIC PANEL
ALT: 16 U/L (ref 0–35)
AST: 15 U/L (ref 0–37)
Albumin: 4.5 g/dL (ref 3.5–5.2)
Alkaline Phosphatase: 81 U/L (ref 39–117)
BUN: 12 mg/dL (ref 6–23)
CO2: 30 mEq/L (ref 19–32)
Calcium: 9.6 mg/dL (ref 8.4–10.5)
Chloride: 101 mEq/L (ref 96–112)
Creatinine, Ser: 1.11 mg/dL (ref 0.40–1.20)
GFR: 54.51 mL/min — ABNORMAL LOW (ref >60.00)
Glucose, Bld: 82 mg/dL (ref 70–99)
Potassium: 4.3 mEq/L (ref 3.5–5.1)
Sodium: 140 mEq/L (ref 135–145)
Total Bilirubin: 0.6 mg/dL (ref 0.2–1.2)
Total Protein: 7.5 g/dL (ref 6.0–8.3)

## 2016-10-13 LAB — LIPID PANEL
CHOL/HDL RATIO: 4
CHOLESTEROL: 242 mg/dL — AB (ref 0–200)
HDL: 53.8 mg/dL (ref >39.00)
LDL CALC: 164 mg/dL — AB (ref 0–99)
NONHDL: 187.8
Triglycerides: 117 mg/dL (ref 0.0–149.0)
VLDL: 23.4 mg/dL (ref 0.0–40.0)

## 2016-10-13 LAB — TSH: TSH: 8.43 u[IU]/mL — ABNORMAL HIGH (ref 0.35–4.50)

## 2016-10-13 LAB — FOLLICLE STIMULATING HORMONE: FSH: 66.3 m[IU]/mL

## 2016-10-13 NOTE — Assessment & Plan Note (Signed)
We will check renal function. I'm unsure if she actually has CKD given that she had no proteinuria on recent check. If GFR is greater than 60 we'll remove this problem.

## 2016-10-13 NOTE — Progress Notes (Signed)
  Tommi Rumps, MD Phone: [phone]  Patty Young is a 55 y.o. female who presents today for f/u.  HYPERLIPIDEMIA Symptoms Chest pain on exertion:  no   Leg claudication:   no Has been working on diet. Has essentially cut sugar out. Has not been exercising.  Depression/anxiety: Patient does note some sadness today as it has been 4 years since her mother died. She also notes some anxiety. Some days she just wants to be left alone. Notes some moodiness. Currently on Wellbutrin and Zoloft.  She thinks she may be going through menopause. She's had a hysterectomy. She's been having hot flashes the last several months. Notes she starts to feel warm on the inside and works its way out. Resolves fairly quickly. Does note some heat intolerance. Her weight is up. She has had thyroid dysfunction previously.   PMH: nonsmoker.   ROS see history of present illness  Objective  Physical Exam Vitals:   10/13/16 0802  BP: 124/80  Pulse: 69  Temp: 97.9 F (36.6 C)    BP Readings from Last 3 Encounters:  10/13/16 124/80  04/14/16 130/70  10/12/15 114/76   Wt Readings from Last 3 Encounters:  10/13/16 179 lb 9.6 oz (81.5 kg)  04/14/16 175 lb 8 oz (79.6 kg)  10/12/15 172 lb 9.6 oz (78.3 kg)    Physical Exam  Constitutional: No distress.  Cardiovascular: Normal rate, regular rhythm and normal heart sounds.   Pulmonary/Chest: Effort normal and breath sounds normal.  Neurological: She is alert. Gait normal.  Skin: Skin is warm and dry. She is not diaphoretic.  Psychiatric:  Mood sad, affect intermittently laughing and crying     Assessment/Plan: Please see individual problem list.  Depression Worsened recently. Also some anxiety. Discussed taking her Wellbutrin twice daily as she has only been doing this once daily. She'll continue the Zoloft. She'll monitor and if not improving she'll let us know.  Hyperlipidemia Check lipid panel  CKD (chronic kidney disease) stage 2,  GFR 60-89 ml/min We will check renal function. I'm unsure if she actually has CKD given that she had no proteinuria on recent check. If GFR is greater than 60 we'll remove this problem.  Hot flashes Suspect patient is going through the menopausal process. Could potentially be related to her prior thyroid dysfunction. We'll check FSH and TSH.   Orders Placed This Encounter  Procedures  . [place]  . TSH  . Comp Met (CMET)  . Lipid panel    Tommi Rumps, MD [facility]

## 2016-10-13 NOTE — Assessment & Plan Note (Signed)
Worsened recently. Also some anxiety. Discussed taking her Wellbutrin twice daily as she has only been doing this once daily. She'll continue the Zoloft. She'll monitor and if not improving she'll let us know.

## 2016-10-13 NOTE — Assessment & Plan Note (Signed)
Suspect patient is going through the menopausal process. Could potentially be related to her prior thyroid dysfunction. We'll check FSH and TSH.

## 2016-10-13 NOTE — Patient Instructions (Addendum)
Nice to see you. We will check some lab work and contact you with the results. Please start to exercise. Start taking the Wellbutrin twice daily.

## 2016-10-13 NOTE — Progress Notes (Signed)
Pre visit review using our clinic review tool, if applicable. No additional management support is needed unless otherwise documented below in the visit note. 

## 2016-10-13 NOTE — Assessment & Plan Note (Signed)
Check lipid panel  

## 2016-10-21 ENCOUNTER — Other Ambulatory Visit: Payer: Self-pay | Admitting: Family Medicine

## 2016-10-21 DIAGNOSIS — R7989 Other specified abnormal findings of blood chemistry: Secondary | ICD-10-CM

## 2016-10-21 DIAGNOSIS — E785 Hyperlipidemia, unspecified: Secondary | ICD-10-CM

## 2016-10-21 MED ORDER — ROSUVASTATIN CALCIUM 20 MG PO TABS: 20.0000 mg | ORAL_TABLET | Freq: Every day | ORAL | 3 refills | Status: DC

## 2016-11-24 ENCOUNTER — Other Ambulatory Visit (INDEPENDENT_AMBULATORY_CARE_PROVIDER_SITE_OTHER): Payer: 59

## 2016-11-24 DIAGNOSIS — R946 Abnormal results of thyroid function studies: Secondary | ICD-10-CM | POA: Diagnosis not present

## 2016-11-24 DIAGNOSIS — R7989 Other specified abnormal findings of blood chemistry: Secondary | ICD-10-CM

## 2016-11-24 DIAGNOSIS — E785 Hyperlipidemia, unspecified: Secondary | ICD-10-CM | POA: Diagnosis not present

## 2016-11-24 LAB — LDL CHOLESTEROL, DIRECT: Direct LDL: 64 mg/dL

## 2016-11-24 LAB — HEPATIC FUNCTION PANEL
ALT: 21 U/L (ref 0–35)
AST: 22 U/L (ref 0–37)
Albumin: 4.5 g/dL (ref 3.5–5.2)
Alkaline Phosphatase: 79 U/L (ref 39–117)
BILIRUBIN DIRECT: 0.1 mg/dL (ref 0.0–0.3)
BILIRUBIN TOTAL: 0.5 mg/dL (ref 0.2–1.2)
Total Protein: 7.8 g/dL (ref 6.0–8.3)

## 2016-11-24 LAB — TSH: TSH: 5.44 u[IU]/mL — ABNORMAL HIGH (ref 0.35–4.50)

## 2016-11-25 ENCOUNTER — Other Ambulatory Visit (INDEPENDENT_AMBULATORY_CARE_PROVIDER_SITE_OTHER): Payer: 59

## 2016-11-25 DIAGNOSIS — R946 Abnormal results of thyroid function studies: Secondary | ICD-10-CM

## 2016-11-25 DIAGNOSIS — R7989 Other specified abnormal findings of blood chemistry: Secondary | ICD-10-CM

## 2016-11-25 LAB — T4, FREE: FREE T4: 0.62 ng/dL (ref 0.60–1.60)

## 2016-11-25 LAB — T3, FREE: T3, Free: 2.8 pg/mL (ref 2.3–4.2)

## 2016-11-25 NOTE — Addendum Note (Signed)
Addended by: Penne LashWIGGINS, Bianney Rockwood N on: 11/25/2016 02:32 PM   Modules accepted: Orders

## 2017-04-12 ENCOUNTER — Other Ambulatory Visit: Payer: Self-pay | Admitting: Family Medicine

## 2017-04-14 ENCOUNTER — Telehealth: Payer: Self-pay | Admitting: Family Medicine

## 2017-04-14 ENCOUNTER — Ambulatory Visit (INDEPENDENT_AMBULATORY_CARE_PROVIDER_SITE_OTHER): Payer: 59

## 2017-04-14 ENCOUNTER — Ambulatory Visit (INDEPENDENT_AMBULATORY_CARE_PROVIDER_SITE_OTHER): Payer: 59 | Admitting: Family Medicine

## 2017-04-14 ENCOUNTER — Encounter: Payer: Self-pay | Admitting: Family Medicine

## 2017-04-14 VITALS — BP 128/84 | HR 89 | Temp 98.1°F | Wt 183.8 lb

## 2017-04-14 DIAGNOSIS — Z23 Encounter for immunization: Secondary | ICD-10-CM

## 2017-04-14 DIAGNOSIS — N183 Chronic kidney disease, stage 3 unspecified: Secondary | ICD-10-CM

## 2017-04-14 DIAGNOSIS — N189 Chronic kidney disease, unspecified: Secondary | ICD-10-CM | POA: Diagnosis not present

## 2017-04-14 DIAGNOSIS — N182 Chronic kidney disease, stage 2 (mild): Secondary | ICD-10-CM

## 2017-04-14 DIAGNOSIS — M89311 Hypertrophy of bone, right shoulder: Secondary | ICD-10-CM

## 2017-04-14 DIAGNOSIS — M79671 Pain in right foot: Secondary | ICD-10-CM | POA: Diagnosis not present

## 2017-04-14 DIAGNOSIS — M791 Myalgia, unspecified site: Secondary | ICD-10-CM | POA: Diagnosis not present

## 2017-04-14 DIAGNOSIS — M79673 Pain in unspecified foot: Secondary | ICD-10-CM | POA: Insufficient documentation

## 2017-04-14 DIAGNOSIS — R0989 Other specified symptoms and signs involving the circulatory and respiratory systems: Secondary | ICD-10-CM

## 2017-04-14 DIAGNOSIS — F325 Major depressive disorder, single episode, in full remission: Secondary | ICD-10-CM

## 2017-04-14 DIAGNOSIS — E785 Hyperlipidemia, unspecified: Secondary | ICD-10-CM | POA: Diagnosis not present

## 2017-04-14 DIAGNOSIS — R222 Localized swelling, mass and lump, trunk: Secondary | ICD-10-CM

## 2017-04-14 LAB — TSH: TSH: 2.6 u[IU]/mL (ref 0.35–4.50)

## 2017-04-14 LAB — COMPREHENSIVE METABOLIC PANEL
ALBUMIN: 4.5 g/dL (ref 3.5–5.2)
ALT: 22 U/L (ref 0–35)
AST: 19 U/L (ref 0–37)
Alkaline Phosphatase: 77 U/L (ref 39–117)
BUN: 17 mg/dL (ref 6–23)
CHLORIDE: 99 meq/L (ref 96–112)
CO2: 31 mEq/L (ref 19–32)
CREATININE: 1.05 mg/dL (ref 0.40–1.20)
Calcium: 9.7 mg/dL (ref 8.4–10.5)
GFR: 58.01 mL/min — ABNORMAL LOW (ref >60.00)
GLUCOSE: 88 mg/dL (ref 70–99)
POTASSIUM: 3.9 meq/L (ref 3.5–5.1)
SODIUM: 138 meq/L (ref 135–145)
TOTAL PROTEIN: 7.9 g/dL (ref 6.0–8.3)
Total Bilirubin: 0.6 mg/dL (ref 0.2–1.2)

## 2017-04-14 LAB — MICROALBUMIN / CREATININE URINE RATIO
Creatinine,U: 178.6 mg/dL
MICROALB UR: 0.9 mg/dL (ref 0.0–1.9)
Microalb Creat Ratio: 0.5 mg/g (ref 0.0–30.0)

## 2017-04-14 LAB — CK: Total CK: 96 U/L (ref 7–177)

## 2017-04-14 LAB — LDL CHOLESTEROL, DIRECT: LDL DIRECT: 72 mg/dL

## 2017-04-14 NOTE — Progress Notes (Signed)
Tommi Rumps, MD Phone: [phone]  Patty Young is a 56 y.o. female who presents today for follow-up.  Depression: Notes this is improved. Does note some life stressors though not much depression. Taking Zoloft Wellbutrin. Minimal anxiety. No SI or HI.  Hyperlipidemia: Taking Crestor. No chest pain. No claudication. No right upper quadrant pain. Possible myalgias by the end of the day in her legs. They do not worsen with exertion.  She has had right posterior heel discomfort for some time now. Notes at times she can't really walk on it. No swelling. No injury. She's been soaking it.  CKD: Was previously on lisinopril though urine testing did not reveal any protein. No significant NSAID use.  Patient notes prominence of her right clavicle for some time now. She forgot to mention this at her last visit. No pain or tenderness. It is just larger than it used to be.  PMH: nonsmoker.   ROS see history of present illness  Objective  Physical Exam Vitals:   04/14/17 0805  BP: 128/84  Pulse: 89  Temp: 98.1 F (36.7 C)  SpO2: 97%    BP Readings from Last 3 Encounters:  04/14/17 128/84  10/13/16 124/80  04/14/16 130/70   Wt Readings from Last 3 Encounters:  04/14/17 183 lb 12.8 oz (83.4 kg)  10/13/16 179 lb 9.6 oz (81.5 kg)  04/14/16 175 lb 8 oz (79.6 kg)    Physical Exam  Constitutional: No distress.  Cardiovascular: Normal rate, regular rhythm and normal heart sounds.   Pulmonary/Chest: Effort normal and breath sounds normal.  Musculoskeletal: She exhibits no edema.  Prominence over the sternal aspect of the right clavicle, no tenderness, no bony defects, right heel with tenderness posteriorly at the insertion site of the Achilles tendon, no Achilles tendon tenderness, 2+ right DP and PT pulse, 2+ left PT pulse, difficult to palpate left DP pulse  Neurological: She is alert. Gait normal.  Skin: She is not diaphoretic.     Assessment/Plan: Please see individual  problem list.  Depression Improved. Continue current medication.  Hyperlipidemia Patient with possible myalgias. Check lab work and then consider decreasing the dose of Crestor.  Heel pain Possibly tendinitis. She can ice and do stretches. Refer to podiatry.  CKD (chronic kidney disease) stage 2, GFR 60-89 ml/min Possibly stage III now based on prior lab work. We'll recheck urine and kidney function.  Hypertrophy of right clavicle Bony prominence noted. Checking x-ray.   Orders Placed This Encounter  Procedures  . DG Clavicle Right    Standing Status:   Future    Number of Occurrences:   1    Standing Expiration Date:   06/14/2018    Order Specific Question:   Reason for Exam (SYMPTOM  OR DIAGNOSIS REQUIRED)    Answer:   right clavicle bony prominence, no injury, no pain    Order Specific Question:   Is patient pregnant?    Answer:   No    Order Specific Question:   Preferred imaging location?    Answer:   [facility] Specific Question:   Radiology Contrast Protocol - do NOT remove file path    Answer:   [email or web address]  . Flu Vaccine QUAD 36+ mos IM  . Comp Met (CMET)  . LDL cholesterol, direct  . CK (Creatine Kinase)  . Urine Microalbumin w/creat. ratio  . TSH  . Ambulatory referral to Podiatry    Referral Priority:   Routine    Referral  Type:   Consultation    Referral Reason:   Specialty Services Required    Requested Specialty:   Podiatry    Number of Visits Requested:   [place], MD [place]

## 2017-04-14 NOTE — Assessment & Plan Note (Signed)
Patient with possible myalgias. Check lab work and then consider decreasing the dose of Crestor.

## 2017-04-14 NOTE — Assessment & Plan Note (Signed)
Possibly tendinitis. She can ice and do stretches. Refer to podiatry.

## 2017-04-14 NOTE — Assessment & Plan Note (Signed)
Improved.  Continue current medication. 

## 2017-04-14 NOTE — Telephone Encounter (Signed)
Spoke with patient. Advised of XR findings. Given palpable lesion we will proceed with CT chest w contrast. Discussed labs. She will decrease crestor to 10 mg daily and see if that helps with myalgias. We will obtain renal US given CKD stage 3.

## 2017-04-14 NOTE — Assessment & Plan Note (Signed)
Possibly stage III now based on prior lab work. We'll recheck urine and kidney function.

## 2017-04-14 NOTE — Assessment & Plan Note (Signed)
Bony prominence noted. Checking x-ray.

## 2017-04-14 NOTE — Patient Instructions (Signed)
Nice to see you. We'll check some lab work today. Please do the exercises for your foot and ice it.   Achilles Tendinitis Rehab Ask your health care provider which exercises are safe for you. Do exercises exactly as told by your health care provider and adjust them as directed. It is normal to feel mild stretching, pulling, tightness, or discomfort as you do these exercises, but you should stop right away if you feel sudden pain or your pain gets worse. Do not begin these exercises until told by your health care provider. Stretching and range of motion exercises These exercises warm up your muscles and joints and improve the movement and flexibility of your ankle. These exercises also help to relieve pain, numbness, and tingling. Exercise A: Standing wall calf stretch, knee straight  1. Stand with your hands against a wall. 2. Extend your __________ leg behind you and bend your front knee slightly. Keep both of your heels on the floor. 3. Point the toes of your back foot slightly inward. 4. Keeping your heels on the floor and your back knee straight, shift your weight toward the wall. Do not allow your back to arch. You should feel a gentle stretch in your calf. 5. Hold this position for seconds. Repeat __________ times. Complete this stretch __________ times per day. Exercise B: Standing wall calf stretch, knee bent 1. Stand with your hands against a wall. 2. Extend your __________ leg behind you, and bend your front knee slightly. Keep both of your heels on the floor. 3. Point the toes of your back foot slightly inward. 4. Keeping your heels on the floor, unlock your back knee so that it is bent. You should feel a gentle stretch deep in your calf. 5. Hold this position for __________ seconds. Repeat __________ times. Complete this stretch __________ times per day. Strengthening exercises These exercises build strength and control of your ankle. Endurance is the ability to use your muscles  for a long time, even after they get tired. Exercise C: Plantar flexion with band  1. Sit on the floor with your __________ leg extended. You may put a pillow under your calf to give your foot more room to move. 2. Loop a rubber exercise band or tube around the ball of your __________ foot. The ball of your foot is on the walking surface, right under your toes. The band or tube should be slightly tense when your foot is relaxed. If the band or tube slips, you can put on your shoe or put a washcloth between the band and your foot to help it stay in place. 3. Slowly point your toes downward, pushing them away from you. 4. Hold this position for __________ seconds. 5. Slowly release the tension in the band or tube, controlling smoothly until your foot is back to the starting position. Repeat __________ times. Complete this exercise __________ times per day. Exercise D: Heel raise with eccentric lower  1. Stand on a step with the balls of your feet. The ball of your foot is on the walking surface, right under your toes. ? Do not put your heels on the step. ? For balance, rest your hands on the wall or on a railing. 2. Rise up onto the balls of your feet. 3. Keeping your heels up, shift all of your weight to your __________ leg and pick up your other leg. 4. Slowly lower your __________ leg so your heel drops below the level of the step. 5. Put down  your foot. If told by your health care provider, build up to:  3 sets of 15 repetitions while keeping your knees straight.  3 sets of 15 repetitions while keeping your knees bent as far as told by your health care provider.  Complete this exercise __________ times per day. If this exercise is too easy, try doing it while wearing a backpack with weights in it. Balance exercises These exercises improve or maintain your balance. Balance is important in preventing falls. Exercise E: Single leg stand 1. Without shoes, stand near a railing or in a door  frame. Hold on to the railing or door frame as needed. 2. Stand on your __________ foot. Keep your big toe down on the floor and try to keep your arch lifted. 3. Hold this position for __________ seconds. Repeat __________ times. Complete this exercise __________ times per day. If this exercise is too easy, you can try it with your eyes closed or while standing on a pillow. This information is not intended to replace advice given to you by your health care provider. Make sure you discuss any questions you have with your health care provider. Document Released: 01/12/2005 Document Revised: 02/18/2016 Document Reviewed: 02/17/2015 Elsevier Interactive Patient Education  [employer]2018 Elsevier Inc.

## 2017-04-20 ENCOUNTER — Other Ambulatory Visit: Payer: Self-pay | Admitting: Family Medicine

## 2017-04-26 ENCOUNTER — Ambulatory Visit
Admission: RE | Admit: 2017-04-26 | Discharge: 2017-04-26 | Disposition: A | Discharge status: Home / Self Care | Payer: 59 | Source: Ambulatory Visit | Attending: Family Medicine | Admitting: Family Medicine

## 2017-04-26 DIAGNOSIS — N183 Chronic kidney disease, stage 3 unspecified: Secondary | ICD-10-CM

## 2017-04-28 ENCOUNTER — Ambulatory Visit: Payer: Self-pay | Admitting: Podiatry

## 2017-05-01 ENCOUNTER — Other Ambulatory Visit: Payer: Self-pay | Admitting: Family Medicine

## 2017-05-02 ENCOUNTER — Other Ambulatory Visit: Payer: Self-pay | Admitting: Family Medicine

## 2017-05-02 DIAGNOSIS — N183 Chronic kidney disease, stage 3 unspecified: Secondary | ICD-10-CM

## 2017-05-02 DIAGNOSIS — M7989 Other specified soft tissue disorders: Secondary | ICD-10-CM

## 2017-05-03 ENCOUNTER — Ambulatory Visit (INDEPENDENT_AMBULATORY_CARE_PROVIDER_SITE_OTHER): Payer: 59

## 2017-05-03 DIAGNOSIS — R0989 Other specified symptoms and signs involving the circulatory and respiratory systems: Secondary | ICD-10-CM

## 2017-05-16 ENCOUNTER — Encounter: Payer: Self-pay | Admitting: General Surgery

## 2017-05-16 ENCOUNTER — Ambulatory Visit: Payer: 59 | Admitting: General Surgery

## 2017-05-16 VITALS — BP 148/84 | HR 88 | Resp 12 | Ht 67.0 in | Wt 186.4 lb

## 2017-05-16 DIAGNOSIS — R222 Localized swelling, mass and lump, trunk: Secondary | ICD-10-CM | POA: Insufficient documentation

## 2017-05-16 NOTE — Patient Instructions (Signed)
The patient is aware to call back for any questions or concerns.  
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
## 2017-07-12 DIAGNOSIS — K1379 Other lesions of oral mucosa: Secondary | ICD-10-CM | POA: Insufficient documentation

## 2017-07-12 DIAGNOSIS — R768 Other specified abnormal immunological findings in serum: Secondary | ICD-10-CM | POA: Insufficient documentation

## 2017-07-12 DIAGNOSIS — R200 Anesthesia of skin: Secondary | ICD-10-CM | POA: Insufficient documentation

## 2017-07-12 DIAGNOSIS — R7689 Other specified abnormal immunological findings in serum: Secondary | ICD-10-CM | POA: Insufficient documentation

## 2017-07-26 ENCOUNTER — Other Ambulatory Visit: Payer: Self-pay | Admitting: Family Medicine

## 2017-10-13 ENCOUNTER — Ambulatory Visit: Payer: 59 | Admitting: Family Medicine

## 2017-10-16 ENCOUNTER — Other Ambulatory Visit: Payer: Self-pay

## 2017-10-16 ENCOUNTER — Encounter: Payer: Self-pay | Admitting: Family Medicine

## 2017-10-16 ENCOUNTER — Ambulatory Visit: Payer: 59 | Admitting: Family Medicine

## 2017-10-16 VITALS — BP 118/80 | HR 71 | Temp 97.9°F | Wt 184.0 lb

## 2017-10-16 DIAGNOSIS — F325 Major depressive disorder, single episode, in full remission: Secondary | ICD-10-CM | POA: Diagnosis not present

## 2017-10-16 DIAGNOSIS — Z1231 Encounter for screening mammogram for malignant neoplasm of breast: Secondary | ICD-10-CM | POA: Diagnosis not present

## 2017-10-16 DIAGNOSIS — N182 Chronic kidney disease, stage 2 (mild): Secondary | ICD-10-CM

## 2017-10-16 DIAGNOSIS — E785 Hyperlipidemia, unspecified: Secondary | ICD-10-CM

## 2017-10-16 DIAGNOSIS — E063 Autoimmune thyroiditis: Secondary | ICD-10-CM

## 2017-10-16 DIAGNOSIS — R222 Localized swelling, mass and lump, trunk: Secondary | ICD-10-CM

## 2017-10-16 DIAGNOSIS — Z1239 Encounter for other screening for malignant neoplasm of breast: Secondary | ICD-10-CM

## 2017-10-16 LAB — COMPREHENSIVE METABOLIC PANEL
ALBUMIN: 4.2 g/dL (ref 3.5–5.2)
ALT: 16 U/L (ref 0–35)
AST: 17 U/L (ref 0–37)
Alkaline Phosphatase: 69 U/L (ref 39–117)
BILIRUBIN TOTAL: 0.5 mg/dL (ref 0.2–1.2)
BUN: 12 mg/dL (ref 6–23)
CO2: 28 mEq/L (ref 19–32)
CREATININE: 1.01 mg/dL (ref 0.40–1.20)
Calcium: 9.6 mg/dL (ref 8.4–10.5)
Chloride: 103 mEq/L (ref 96–112)
GFR: 60.55 mL/min (ref >60.00)
GLUCOSE: 87 mg/dL (ref 70–99)
POTASSIUM: 4.5 meq/L (ref 3.5–5.1)
SODIUM: 139 meq/L (ref 135–145)
TOTAL PROTEIN: 7.3 g/dL (ref 6.0–8.3)

## 2017-10-16 LAB — TSH: TSH: 0.73 u[IU]/mL (ref 0.35–4.50)

## 2017-10-16 LAB — LIPID PANEL
Cholesterol: 207 mg/dL — ABNORMAL HIGH (ref 0–200)
HDL: 50.1 mg/dL (ref >39.00)
LDL CALC: 141 mg/dL — AB (ref 0–99)
NonHDL: 157.15
TRIGLYCERIDES: 79 mg/dL (ref 0.0–149.0)
Total CHOL/HDL Ratio: 4
VLDL: 15.8 mg/dL (ref 0.0–40.0)

## 2017-10-16 NOTE — Assessment & Plan Note (Signed)
She has been asymptomatic.  Due for recheck of TSH.

## 2017-10-16 NOTE — Assessment & Plan Note (Signed)
She saw nephrology.  We will recheck renal function today.  She will avoid NSAIDs.

## 2017-10-16 NOTE — Progress Notes (Signed)
  Tommi Rumps, MD Phone: [phone]  Patty Young is a 57 y.o. female who presents today for f/u.  Depression: Notes this is quite a bit better.  She is on Wellbutrin and Zoloft.  No anxiety.  No SI.  Hyperlipidemia.  She stopped the Crestor related to myalgias.  She notes no chest pain or claudication.  She wants to see a nutritionist.  She has a history of Hashimoto's.  TSH has been slightly elevated though did trend down to normal.  No medications for this.  No skin changes.  Always has heat intolerance.  No cold intolerance.  She saw nephrology for chronic kidney disease.  They check lupus testing and noted her markers were high though she saw  rheumatology and they advised she likely did not have lupus.  She is avoiding ibuprofen.  She did state the rheumatologist advised that her thyroid function was off.  Social History   Tobacco Use  Smoking Status Never Smoker  Smokeless Tobacco Never Used     ROS see history of present illness  Objective  Physical Exam Vitals:   10/16/17 0944 10/16/17 1055  BP: 120/90 118/80  Pulse: 71   Temp: 97.9 F (36.6 C)   SpO2: 98%     BP Readings from Last 3 Encounters:  10/16/17 118/80  05/16/17 (!) 148/84  04/14/17 128/84   Wt Readings from Last 3 Encounters:  10/16/17 184 lb (83.5 kg)  05/16/17 186 lb 6.4 oz (84.6 kg)  04/14/17 183 lb 12.8 oz (83.4 kg)    Physical Exam  Constitutional: No distress.  Cardiovascular: Normal rate, regular rhythm and normal heart sounds.  Pulmonary/Chest: Effort normal and breath sounds normal.  Musculoskeletal: She exhibits no edema.  Neurological: She is alert.  Skin: Skin is warm and dry. She is not diaphoretic.     Assessment/Plan: Please see individual problem list.  Hashimoto's disease She has been asymptomatic.  Due for recheck of TSH.  CKD (chronic kidney disease) stage 2, GFR 60-89 ml/min She saw nephrology.  We will recheck renal function today.  She will avoid  NSAIDs.  Depression Improved.  Continue current regimen.  Hyperlipidemia Check lipid panel.  Consider pravastatin or Zetia for further treatment.  Clavicular area fullness Evaluated by general surgery.  They have opted to monitor for now.   Health Maintenance: Mammogram ordered.  Patient will call to schedule.  Orders Placed This Encounter  Procedures  . MM 3D SCREEN BREAST BILATERAL    Standing Status:   Future    Standing Expiration Date:   12/17/2018    Order Specific Question:   Reason for Exam (SYMPTOM  OR DIAGNOSIS REQUIRED)    Answer:   screening    Order Specific Question:   Is the patient pregnant?    Answer:   No    Order Specific Question:   Preferred imaging location?    Answer:   [facility]  . TSH  . Comp Met (CMET)  . Lipid panel  . Amb ref to Medical Nutrition Therapy-MNT    Referral Priority:   Routine    Referral Type:   Consultation    Referral Reason:   Specialty Services Required    Requested Specialty:   Nutrition    Number of Visits Requested:   1    No orders of the defined types were placed in this encounter.    Tommi Rumps, MD [place]

## 2017-10-16 NOTE — Patient Instructions (Signed)
Nice to see you. I am glad you are feeling better with regards to your depression. We will check lab work today and contact you with the results.

## 2017-10-16 NOTE — Assessment & Plan Note (Signed)
Improved.  Continue current regimen

## 2017-10-16 NOTE — Assessment & Plan Note (Signed)
Check lipid panel.  Consider pravastatin or Zetia for further treatment.

## 2017-10-16 NOTE — Assessment & Plan Note (Signed)
Evaluated by general surgery.  They have opted to monitor for now.

## 2017-10-24 ENCOUNTER — Encounter: Payer: Self-pay | Admitting: Family Medicine

## 2017-11-15 ENCOUNTER — Encounter: Payer: Self-pay | Admitting: Dietician

## 2017-11-15 ENCOUNTER — Encounter: Payer: 59 | Attending: Family Medicine | Admitting: Dietician

## 2017-11-15 VITALS — Ht 67.0 in | Wt 184.1 lb

## 2017-11-15 DIAGNOSIS — Z713 Dietary counseling and surveillance: Secondary | ICD-10-CM | POA: Insufficient documentation

## 2017-11-15 DIAGNOSIS — E785 Hyperlipidemia, unspecified: Secondary | ICD-10-CM | POA: Diagnosis not present

## 2017-11-15 NOTE — Progress Notes (Signed)
Medical Nutrition Therapy: Visit start time: 1345  end time: 1445 Assessment:  Diagnosis: Hyperlipidemia Past medical history: Hashimoto's Thyroiditis, Vitamin D deficiency, Depression Psychosocial issues/ stress concerns: None Preferred learning method:  Jill Alexanders . Hands-on  Current weight: 184.1#  Height:   Medications, supplements: Vitamin D3  Progress and evaluation: Per patient, cholesterol has been borderline high for several years. Most recent readings: (10/16/17) Chol 207 H, LDL 141 H. She works two jobs so her husband does the majority of the cooking. He is a diabetic and the two of them have worked to make prepared meals more health-centered. They have purchased an air fryer, no longer fry foods the traditional way, use olive or canola oil for cooking, use white/wheat bread, and do not drink sugar sweetened beverages. She will occasionally have a sweet tea when eating out, but they do not eat out often d/t time and cost. D/t her schedule she frequently skips meals though does report successful wt loss down to 168# with more consistent, frequent meals. At that time she had been incorporating snacks like yogurt and Nutrigrain bars.  Physical activity: Not at this time  Dietary Intake:  Usual eating pattern includes 2-3 meals and 0 snacks per day. Dining out frequency: 2 meals per week.  Breakfast: pb&j sandwich + a few chips, [employer] sometimes Snack: n/a Lunch: leftovers 3 days/week, meat + beans, sandwich with cheese and meat, Healthy Choice frozen meal Snack: n/a Supper: baked potato, chicken wings in the air fryer, low sodium/ sugar free canned vegetables, canned fruit Snack: n/a Beverages: water, Pepsi once a day, sweet tea occasionally when eating out  Nutrition Care Education: Topics covered: heart healthy/ diabetic cooking for husband who is diabetic, fresh vs. Canned vs. Frozen fruits and vegetables health-wise, sugar sweetened beverages, consistent eating  schedule Basic nutrition: basic food groups, appropriate nutrient balance, appropriate meal and snack schedule, general nutrition guidelines    Weight control: behavioral changes for weight loss Advanced nutrition: cooking techniques, dining out, food label reading Hyperlipidemia: target goals for lipids, healthy and unhealthy fats, role of fiber Other lifestyle changes: benefits of making changes, increasing motivation, readiness for change, identifying habits that need to change  Nutritional Diagnosis:  NI-5.6.3 Inappropriate intake of fats (specify): saturated and trans As related to Hyperlipidemia.  As evidenced by Chol 207, LDL 141, frequent intake of prepared / frozen meals and low fiber diet.  Intervention: Discussion as noted above. Patient will work to incorporate more dietary fiber, more heart healthy fats, and on eating on a more consistent schedule. She will also look to include more sources of protein at meals and snacks, along with some fiber and healthy fats to increase satiety. Having more variety/ different food groups at meal times will be a focus for herself and her husband.  Education Materials given:  . General diet guidelines for Cholesterol-lowering/ Heart health . Goals/ instructions  Learner/ who was taught:  . Patient   Level of understanding: Marland Kitchen Verbalizes/ demonstrates competency  Demonstrated degree of understanding via:   Teach back Learning barriers: . None  Willingness to learn/ readiness for change: . Acceptance, ready for change  Monitoring and Evaluation:  Dietary intake, exercise, lipid labs, and body weight      follow up: prn

## 2017-11-15 NOTE — Patient Instructions (Addendum)
   When choosing meals, aim for at least 15g of protein per meal (3oz)  Mediterranean diet = heart healthy diet  Try to stay on a regular eating schedule as much as possible (IE not skipping meals). Choose mini meals or snacks high in protein, fiber, and moderate to low in heart-healthy fats when possible  To lower cholesterol  Incorporate fiber (fruits, vegetables, whole grains). Look for foods with 4g of fiber or more per serving when looking for high fiber foods. Females should aim for 25 g/day  Overall having a diet that is lower in fat, focusing on the heart healthy fats as the majority of your fat intake. These can include things like olives and olive oil, nuts & seeds, 1 egg yolk/day, salmon, tuna, hummus  Consume 200 mg or less of cholesterol per day  Consume more white meats than red meats

## 2017-11-16 ENCOUNTER — Encounter: Payer: Self-pay | Admitting: Dietician

## 2018-01-09 ENCOUNTER — Other Ambulatory Visit: Payer: Self-pay | Admitting: Family Medicine

## 2018-01-18 ENCOUNTER — Ambulatory Visit: Payer: 59 | Admitting: Family Medicine

## 2018-04-16 ENCOUNTER — Ambulatory Visit: Payer: 59 | Admitting: Family Medicine

## 2018-04-16 ENCOUNTER — Encounter: Payer: Self-pay | Admitting: Family Medicine

## 2018-04-16 VITALS — BP 110/76 | HR 86 | Temp 98.4°F | Ht 67.0 in | Wt 183.2 lb

## 2018-04-16 DIAGNOSIS — Z1211 Encounter for screening for malignant neoplasm of colon: Secondary | ICD-10-CM | POA: Diagnosis not present

## 2018-04-16 DIAGNOSIS — G43109 Migraine with aura, not intractable, without status migrainosus: Secondary | ICD-10-CM

## 2018-04-16 DIAGNOSIS — Z23 Encounter for immunization: Secondary | ICD-10-CM

## 2018-04-16 DIAGNOSIS — Z1159 Encounter for screening for other viral diseases: Secondary | ICD-10-CM

## 2018-04-16 DIAGNOSIS — F324 Major depressive disorder, single episode, in partial remission: Secondary | ICD-10-CM | POA: Diagnosis not present

## 2018-04-16 DIAGNOSIS — Z114 Encounter for screening for human immunodeficiency virus [HIV]: Secondary | ICD-10-CM

## 2018-04-16 DIAGNOSIS — E785 Hyperlipidemia, unspecified: Secondary | ICD-10-CM

## 2018-04-16 LAB — LDL CHOLESTEROL, DIRECT: Direct LDL: 161 mg/dL

## 2018-04-16 NOTE — Assessment & Plan Note (Signed)
Stable.  Respond to Imitrex.  She will continue as needed Imitrex.

## 2018-04-16 NOTE — Progress Notes (Signed)
  [name], MD Phone: [phone]  Patty Young is a 57 y.o. female who presents today for f/u.  CC: hld, depression, migraine  Hyperlipidemia: No chest pain or claudication.  Her diet remains unchanged though she is avoiding sweets.  No exercise.  Depression: Notes this comes and goes.  No SI.  No anxiety.  She feels this is fairly well controlled currently.  Taking Zoloft and Wellbutrin.  Migraines: These occur once monthly.  They have been unchanged.  No numbness or weakness.  Imitrex is helpful.  Social History   Tobacco Use  Smoking Status Never Smoker  Smokeless Tobacco Never Used     ROS see history of present illness  Objective  Physical Exam Vitals:   04/16/18 0939  BP: 110/76  Pulse: 86  Temp: 98.4 F (36.9 C)  SpO2: 95%    BP Readings from Last 3 Encounters:  04/16/18 110/76  10/16/17 118/80  05/16/17 (!) 148/84   Wt Readings from Last 3 Encounters:  04/16/18 183 lb 3.2 oz (83.1 kg)  11/15/17 184 lb 1.6 oz (83.5 kg)  10/16/17 184 lb (83.5 kg)    Physical Exam  Constitutional: No distress.  Cardiovascular: Normal rate, regular rhythm and normal heart sounds.  Pulmonary/Chest: Effort normal and breath sounds normal.  Musculoskeletal: She exhibits no edema.  Neurological: She is alert.  Skin: Skin is warm and dry. She is not diaphoretic.     Assessment/Plan: Please see individual problem list.  No problem-specific Assessment & Plan notes found for this encounter.   Health Maintenance: Discussed colon cancer screening.  She is hesitant to do colonoscopy.  She would prefer Cologuard.  I did discuss that if this is positive she would have to go for a diagnostic colonoscopy which may not be covered by her insurance.  Hepatitis C and HIV screening completed as well.  Orders Placed This Encounter  Procedures  . Cologuard  . Hepatitis C Antibody  . HIV antibody (with reflex)  . Direct LDL    No orders of the defined types were placed  in this encounter.    [name], MD [facility]

## 2018-04-16 NOTE — Patient Instructions (Signed)
Nice to see you. We will check lab work today and contact you with the results. Please complete the Cologuard when he gets to your house.

## 2018-04-16 NOTE — Assessment & Plan Note (Signed)
Reports fairly well controlled.  She will continue her current regimen.

## 2018-04-16 NOTE — Assessment & Plan Note (Signed)
I encouraged diet and exercise.  Check LDL.

## 2018-04-17 LAB — HIV ANTIBODY (ROUTINE TESTING W REFLEX): HIV 1&2 Ab, 4th Generation: NONREACTIVE

## 2018-04-17 LAB — HEPATITIS C ANTIBODY
HEP C AB: NONREACTIVE
SIGNAL TO CUT-OFF: 0.02 (ref <1.00)

## 2018-04-18 ENCOUNTER — Encounter: Payer: Self-pay | Admitting: *Deleted

## 2018-05-30 ENCOUNTER — Encounter: Payer: Self-pay | Admitting: Family Medicine

## 2018-05-31 MED ORDER — VALACYCLOVIR HCL 1 G PO TABS: ORAL_TABLET | ORAL | 3 refills | Status: DC

## 2018-10-17 ENCOUNTER — Other Ambulatory Visit: Payer: Self-pay

## 2018-10-17 ENCOUNTER — Ambulatory Visit (INDEPENDENT_AMBULATORY_CARE_PROVIDER_SITE_OTHER): Payer: 59 | Admitting: Family Medicine

## 2018-10-17 ENCOUNTER — Encounter: Payer: Self-pay | Admitting: Family Medicine

## 2018-10-17 DIAGNOSIS — K625 Hemorrhage of anus and rectum: Secondary | ICD-10-CM | POA: Diagnosis not present

## 2018-10-17 DIAGNOSIS — E785 Hyperlipidemia, unspecified: Secondary | ICD-10-CM

## 2018-10-17 DIAGNOSIS — Z1239 Encounter for other screening for malignant neoplasm of breast: Secondary | ICD-10-CM

## 2018-10-17 DIAGNOSIS — E063 Autoimmune thyroiditis: Secondary | ICD-10-CM

## 2018-10-17 DIAGNOSIS — F325 Major depressive disorder, single episode, in full remission: Secondary | ICD-10-CM | POA: Diagnosis not present

## 2018-10-17 NOTE — Progress Notes (Signed)
Virtual Visit via Telephone Note  This visit type was conducted due to national recommendations for restrictions regarding the COVID-19 pandemic (e.g. social distancing).  This format is felt to be most appropriate for this patient at this time.  All issues noted in this document were discussed and addressed.  No physical exam was performed (except for noted visual exam findings with Video Visits).   I connected with Patty Young on 10/18/18 at  8:00 AM EDT by telephone and verified that I am speaking with the correct person using two identifiers. Location patient: home Location provider: work  Persons participating in the virtual visit: patient, provider  I discussed the limitations, risks, security and privacy concerns of performing an evaluation and management service by telephone and the availability of in person appointments. I also discussed with the patient that there may be a patient responsible charge related to this service. The patient expressed understanding and agreed to proceed.  Interactive audio and video telecommunications were attempted between this provider and patient, however failed, due to patient having technical difficulties OR patient did not have access to video capability.  We continued and completed visit with audio only.  Reason for visit: follow-up  HPI: Depression/anxiety: Patient notes these issues are stable.  She notes no more than normal regards to symptoms of these.  No SI.  Taking Wellbutrin and Zoloft.  Hyperlipidemia: No chest pain or claudication.  She has not been exercising.  She is been working 7 days a week so that makes it difficult.  She has been trying to watch her portion sizes and has been eating more fruits and vegetables.  History of Hashimoto's thyroiditis: Patient notes no skin changes, heat intolerance, or cold intolerance.  Bright red blood per rectum: Patient notes about a week ago she had a small amount of blood when she wiped.  There was  no blood in the toilet water.  She does report a history of hemorrhoids though these have not been formally diagnosed.  She has not had colon cancer screening previously.   ROS: See pertinent positives and negatives per HPI.  Past Medical History:  Diagnosis Date  . CKD (chronic kidney disease) stage 2, GFR 60-89 ml/min   . Depression   . Hashimoto's disease   . Hyperlipidemia   . Migraines   . Obesity   . Vitamin D deficiency disease March 2016   11.7    Past Surgical History:  Procedure Laterality Date  . ABDOMINAL HYSTERECTOMY  09/2005   still has ovaries and cervix, needs paps    Family History  Problem Relation Age of Onset  . COPD Mother   . Hypertension Mother   . Osteoporosis Mother   . Hypertension Father   . Hyperlipidemia Brother     SOCIAL HX: Non-smoker.   Current Outpatient Medications:  .  buPROPion (WELLBUTRIN SR) 100 MG 12 hr tablet, TAKE 1 TABLET BY MOUTH TWICE DAILY, Disp: 180 tablet, Rfl: 3 .  Cholecalciferol (VITAMIN D3) 2000 units capsule, Take 2,000 Units by mouth daily., Disp: , Rfl:  .  sertraline (ZOLOFT) 100 MG tablet, TAKE 1 TABLET BY MOUTH DAILY, Disp: 90 tablet, Rfl: 3 .  SUMAtriptan (IMITREX) 100 MG tablet, Take 100 mg by mouth every 2 (two) hours as needed for migraine. May repeat in 2 hours if headache persists or recurs., Disp: , Rfl:  .  valACYclovir (VALTREX) 1000 MG tablet, Take two tablets (2,000 mg) at first sign of outbreak, then two more pills twelve hours later;  four pills per outbreak, Disp: 12 tablet, Rfl: 3  EXAM: This was a telehealth telephone visit and no physical exam was completed.  ASSESSMENT AND PLAN:  Discussed the following assessment and plan:  Bright red blood per rectum - Plan: Ambulatory referral to Gastroenterology  Breast cancer screening - Plan: MM 3D SCREEN BREAST BILATERAL  Hashimoto's disease - Plan: TSH  Major depressive disorder with single episode, in full remission ([place])  Hyperlipidemia,  unspecified hyperlipidemia type - Plan: Lipid panel, Comp Met (CMET)  Hashimoto's disease Asymptomatic.  Will obtain a follow-up TSH.  Depression Stable.  Continue Zoloft and Wellbutrin.  Hyperlipidemia I encouraged diet and exercise.  We will obtain a lipid panel.  Bright red blood per rectum Referred to GI.  Discussed that it could be hemorrhoids though could be some other underlying cause.  Discussed that she is due for a colonoscopy.  Breast cancer screening Mammogram ordered.  Patient to call to schedule.  Social distancing precautions and sick precautions given regarding COVID-19.   I discussed the assessment and treatment plan with the patient. The patient was provided an opportunity to ask questions and all were answered. The patient agreed with the plan and demonstrated an understanding of the instructions.   The patient was advised to call back or seek an in-person evaluation if the symptoms worsen or if the condition fails to improve as anticipated.  I provided 23 minutes of non-face-to-face time during this encounter.   Tommi Rumps, MD

## 2018-10-18 ENCOUNTER — Telehealth: Payer: Self-pay | Admitting: Family Medicine

## 2018-10-18 DIAGNOSIS — Z1239 Encounter for other screening for malignant neoplasm of breast: Secondary | ICD-10-CM | POA: Insufficient documentation

## 2018-10-18 DIAGNOSIS — K625 Hemorrhage of anus and rectum: Secondary | ICD-10-CM

## 2018-10-18 HISTORY — DX: Hemorrhage of anus and rectum: K62.5

## 2018-10-18 NOTE — Telephone Encounter (Signed)
Please call the patient and get her set up for lab work in 6 weeks.  She needs an in office follow-up visit in 6 months as well.  Thanks.

## 2018-10-18 NOTE — Telephone Encounter (Signed)
Pt returned call. Lab and f/u with PCP has been scheduled.

## 2018-10-18 NOTE — Assessment & Plan Note (Signed)
Mammogram ordered.  Patient to call to schedule.

## 2018-10-18 NOTE — Assessment & Plan Note (Signed)
Referred to GI.  Discussed that it could be hemorrhoids though could be some other underlying cause.  Discussed that she is due for a colonoscopy.

## 2018-10-18 NOTE — Assessment & Plan Note (Signed)
I encouraged diet and exercise.  We will obtain a lipid panel.

## 2018-10-18 NOTE — Assessment & Plan Note (Signed)
Stable.  Continue Zoloft and Wellbutrin.

## 2018-10-18 NOTE — Assessment & Plan Note (Signed)
Asymptomatic.  Will obtain a follow-up TSH.

## 2018-10-18 NOTE — Telephone Encounter (Signed)
Lmcb.  To schedule a 6 wks lab and a 6 month f/up with the provider.  Nina,cma

## 2018-10-23 ENCOUNTER — Encounter: Payer: Self-pay | Admitting: Gastroenterology

## 2018-10-23 ENCOUNTER — Other Ambulatory Visit: Payer: Self-pay

## 2018-10-23 ENCOUNTER — Ambulatory Visit (INDEPENDENT_AMBULATORY_CARE_PROVIDER_SITE_OTHER): Payer: 59 | Admitting: Gastroenterology

## 2018-10-23 DIAGNOSIS — Z1211 Encounter for screening for malignant neoplasm of colon: Secondary | ICD-10-CM

## 2018-10-23 DIAGNOSIS — K625 Hemorrhage of anus and rectum: Secondary | ICD-10-CM

## 2018-10-23 MED ORDER — PEG 3350-KCL-NA BICARB-NACL 420 G PO SOLR: 4000.0000 mL | Freq: Once | ORAL | 0 refills | Status: AC

## 2018-10-23 NOTE — Addendum Note (Signed)
Addended by: Jackquline Denmark on: 10/23/2018 11:20 AM   Modules accepted: Orders, SmartSet

## 2018-10-23 NOTE — Progress Notes (Signed)
Patient ID: Patty Young, female   DOB: [birth year], 58 y.o.   MRN: [id number] [facility] for screening [facility] [place]. Called pt and went over prep instructions which were sent to pt via my chart. Suprep was ordered from pharmacy but substitution was permitted with Golytely for insurance coverage. Referral sent to [facility].

## 2018-10-23 NOTE — Progress Notes (Signed)
Patty Young [address]  [address]  [place], [place] [address]  Main: [phone]  Fax: [phone]   Gastroenterology Consultation  Referring Provider:     Glori Luis, MD Primary Care Physician:  Glori Luis, MD Reason for Consultation:     Blood per rectum        HPI:   Virtual Visit via Video Note  I connected with patient on 10/23/18 at  9:30 AM EDT by video ([email or web address]) and verified that I am speaking with the correct person using two identifiers.   I discussed the limitations, risks, security and privacy concerns of performing an evaluation and management service by video and the availability of in person appointments. I also discussed with the patient that there may be a patient responsible charge related to this service. The patient expressed understanding and agreed to proceed.  Location of the patient: Work Government social research officer of provider: Home Participating persons: Patient and provider only (Nursing staff checked in patient via phone but were not physically involved in the video interaction - see their notes)   History of Present Illness: Chief Complaint  Patient presents with  . New Patient (Initial Visit)    BRBPR    Patty Young is a 58 y.o. y/o female referred for consultation & management  by Dr. Birdie Sons, Yehuda Mao, MD.  Patient reports 1 episode of blood per rectum 2 to 3 weeks ago.  States it was minute in amount.  On the toilet paper only and bright light in color.  This is the only time this has occurred in a year.  Prior to that she states she may have had it once in the last few years.  These are not associated with any other symptoms such as abdominal pain, nausea vomiting, weight loss, or altered bowel habits.  Reports soft bowel movements daily with occasional straining only.  Denies any previous colonoscopy.  No family history of colon cancer.  The patient denies abdominal or flank pain, anorexia, nausea or vomiting, dysphagia, change  in bowel habits or black or bloody stools or weight loss.   Past Medical History:  Diagnosis Date  . CKD (chronic kidney disease) stage 2, GFR 60-89 ml/min   . Depression   . Hashimoto's disease   . Hyperlipidemia   . Migraines   . Obesity   . Vitamin D deficiency disease March 2016   11.7    Past Surgical History:  Procedure Laterality Date  . ABDOMINAL HYSTERECTOMY  09/2005   still has ovaries and cervix, needs paps    Prior to Admission medications   Medication Sig Start Date End Date Taking? Authorizing Provider  buPROPion (WELLBUTRIN SR) 100 MG 12 hr tablet TAKE 1 TABLET BY MOUTH TWICE DAILY 07/26/17   Glori Luis, MD  Cholecalciferol (VITAMIN D3) 2000 units capsule Take 2,000 Units by mouth daily.    [provider]  sertraline (ZOLOFT) 100 MG tablet TAKE 1 TABLET BY MOUTH DAILY 01/09/18   Glori Luis, MD  SUMAtriptan (IMITREX) 100 MG tablet Take 100 mg by mouth every 2 (two) hours as needed for migraine. May repeat in 2 hours if headache persists or recurs.    [provider]  valACYclovir (VALTREX) 1000 MG tablet Take two tablets (2,000 mg) at first sign of outbreak, then two more pills twelve hours later; four pills per outbreak 05/31/18   Glori Luis, MD    Family History  Problem Relation Age of Onset  .  COPD Mother   . Hypertension Mother   . Osteoporosis Mother   . Hypertension Father   . Hyperlipidemia Brother      Social History   Tobacco Use  . Smoking status: Never Smoker  . Smokeless tobacco: Never Used  Substance Use Topics  . Alcohol use: No  . Drug use: No    Allergies as of 10/23/2018 - Review Complete 10/17/2018  Allergen Reaction Noted  . Sulfa antibiotics  01/08/2015    Review of Systems:    All systems reviewed and negative except where noted in HPI.   Observations/Objective:  Labs: CBC No results found for: WBC, RBC, HGB, HCT, PLT, MCV, MCH, MCHC, RDW, LYMPHSABS, MONOABS, EOSABS, BASOSABS CMP      Component Value Date/Time   NA 139 10/16/2017 1027   NA 140 04/16/2015 1438   K 4.5 10/16/2017 1027   CL 103 10/16/2017 1027   CO2 28 10/16/2017 1027   GLUCOSE 87 10/16/2017 1027   BUN 12 10/16/2017 1027   BUN 11 04/16/2015 1438   CREATININE 1.01 10/16/2017 1027   CALCIUM 9.6 10/16/2017 1027   PROT 7.3 10/16/2017 1027   ALBUMIN 4.2 10/16/2017 1027   AST 17 10/16/2017 1027   ALT 16 10/16/2017 1027   ALKPHOS 69 10/16/2017 1027   BILITOT 0.5 10/16/2017 1027   GFRNONAA 75 04/16/2015 1438   GFRAA 86 04/16/2015 1438    Imaging Studies: No results found.  Assessment and Plan:   Patty Young is a 58 y.o. y/o female has been referred for bright red blood per rectum  Assessment and Plan: The isolated episode, without any further symptoms, is most likely consistent with underlying hemorrhoids.  Patient denies feeling any skin tags or hemorrhoids when she wipes.  No pain on defecation either.  High-fiber diet MiraLAX or Metamucil daily with goal of 1-2 soft bowel movements daily, if high-fiber diet does not lead to soft bowel movements itself.  If not at goal, patient instructed to increase dose to twice daily.  If loose stools with the medication, patient asked to decrease the medication to every other day, or half dose daily.  Patient verbalized understanding  We will check CBC and ferritin and if these are normal it would be reassuring and most consistent with the isolated episode of bright red blood on toilet paper only be consistent with hemorrhoids  However, we discussed that she has never had a screening colonoscopy and is due for 1, which would also allow us to rule out any underlying lesion such as large polyps or malignancy that can also cause blood per rectum.  She states she is agreeable to that but would only like to do it after the current pandemic situation is better.  This is reasonable as long as her above pending labs are normal.  Follow Up Instructions:  Colonoscopy in 2 to 3 months for screening and bright blood per rectum  I discussed the assessment and treatment plan with the patient. The patient was provided an opportunity to ask questions and all were answered. The patient agreed with the plan and demonstrated an understanding of the instructions.   The patient was advised to call back or seek an in-person evaluation if the symptoms worsen or if the condition fails to improve as anticipated.  I provided 15 minutes of face-to-face time via video software during this encounter.   Pasty SpillersVarnita B Laury Huizar, MD  Speech recognition software was used to dictate the above note.

## 2018-10-24 ENCOUNTER — Encounter: Payer: Self-pay | Admitting: Family Medicine

## 2018-10-29 ENCOUNTER — Telehealth: Payer: Self-pay | Admitting: Gastroenterology

## 2018-10-29 NOTE — Telephone Encounter (Signed)
Kim from [facility] surgery is calling regarding opt being scheduled with Dr. Maximino Greenland in [facility] on the dat 01/15/19 and it is a block time please contact Kim at [phone]

## 2018-10-30 NOTE — Telephone Encounter (Signed)
Patient has been notified that [facility] is not allowing [place] to schedule procedures yet at the time frame 01/15/19.  She has been informed that we will call her back to schedule her colonoscopy at [facility] once the restrictions have been lifted.  She is fine with that.  I told her I would call her back in June.  Also patient states that she is allergic to Sulfate, and SuPrep does contain Sulfate.  The pharmacy has advised her not to use this bowel prep.    I plan to call her back in June to reschedule her colonoscopy, however what should she do as far as her bowel prep?  Please advise.  Thanks [employer]

## 2018-11-27 ENCOUNTER — Other Ambulatory Visit (INDEPENDENT_AMBULATORY_CARE_PROVIDER_SITE_OTHER): Payer: 59

## 2018-11-27 ENCOUNTER — Other Ambulatory Visit: Payer: Self-pay

## 2018-11-27 DIAGNOSIS — E063 Autoimmune thyroiditis: Secondary | ICD-10-CM | POA: Diagnosis not present

## 2018-11-27 DIAGNOSIS — E785 Hyperlipidemia, unspecified: Secondary | ICD-10-CM

## 2018-11-27 LAB — COMPREHENSIVE METABOLIC PANEL
ALT: 21 U/L (ref 0–35)
AST: 15 U/L (ref 0–37)
Albumin: 4.1 g/dL (ref 3.5–5.2)
Alkaline Phosphatase: 84 U/L (ref 39–117)
BUN: 16 mg/dL (ref 6–23)
CO2: 29 mEq/L (ref 19–32)
Calcium: 9.4 mg/dL (ref 8.4–10.5)
Chloride: 102 mEq/L (ref 96–112)
Creatinine, Ser: 0.98 mg/dL (ref 0.40–1.20)
GFR: 58.75 mL/min — ABNORMAL LOW (ref >60.00)
Glucose, Bld: 83 mg/dL (ref 70–99)
Potassium: 4.5 mEq/L (ref 3.5–5.1)
Sodium: 138 mEq/L (ref 135–145)
Total Bilirubin: 0.5 mg/dL (ref 0.2–1.2)
Total Protein: 7 g/dL (ref 6.0–8.3)

## 2018-11-27 LAB — LIPID PANEL
Cholesterol: 239 mg/dL — ABNORMAL HIGH (ref 0–200)
HDL: 56.5 mg/dL (ref >39.00)
LDL Cholesterol: 168 mg/dL — ABNORMAL HIGH (ref 0–99)
NonHDL: 182.05
Total CHOL/HDL Ratio: 4
Triglycerides: 71 mg/dL (ref 0.0–149.0)
VLDL: 14.2 mg/dL (ref 0.0–40.0)

## 2018-11-27 LAB — TSH: TSH: 1.11 u[IU]/mL (ref 0.35–4.50)

## 2019-01-02 ENCOUNTER — Other Ambulatory Visit: Payer: Self-pay | Admitting: Family Medicine

## 2019-01-15 ENCOUNTER — Ambulatory Visit: Admit: 2019-01-15 | Payer: 59 | Admitting: Gastroenterology

## 2019-01-15 SURGERY — COLONOSCOPY WITH PROPOFOL
Anesthesia: Choice

## 2019-01-23 ENCOUNTER — Ambulatory Visit: Payer: 59 | Admitting: Gastroenterology

## 2019-04-17 ENCOUNTER — Ambulatory Visit: Payer: 59 | Admitting: Family Medicine

## 2019-08-19 ENCOUNTER — Ambulatory Visit (INDEPENDENT_AMBULATORY_CARE_PROVIDER_SITE_OTHER): Payer: 59 | Admitting: Family Medicine

## 2019-08-19 ENCOUNTER — Encounter: Payer: Self-pay | Admitting: Family Medicine

## 2019-08-19 ENCOUNTER — Other Ambulatory Visit: Payer: Self-pay

## 2019-08-19 DIAGNOSIS — K625 Hemorrhage of anus and rectum: Secondary | ICD-10-CM

## 2019-08-19 DIAGNOSIS — F325 Major depressive disorder, single episode, in full remission: Secondary | ICD-10-CM | POA: Diagnosis not present

## 2019-08-19 DIAGNOSIS — G2581 Restless legs syndrome: Secondary | ICD-10-CM

## 2019-08-19 DIAGNOSIS — E785 Hyperlipidemia, unspecified: Secondary | ICD-10-CM | POA: Diagnosis not present

## 2019-08-19 DIAGNOSIS — B001 Herpesviral vesicular dermatitis: Secondary | ICD-10-CM | POA: Insufficient documentation

## 2019-08-19 DIAGNOSIS — N182 Chronic kidney disease, stage 2 (mild): Secondary | ICD-10-CM

## 2019-08-19 MED ORDER — VALACYCLOVIR HCL 1 G PO TABS: ORAL_TABLET | ORAL | 3 refills | Status: DC

## 2019-08-19 NOTE — Assessment & Plan Note (Signed)
Valtrex refilled.

## 2019-08-19 NOTE — Assessment & Plan Note (Signed)
Asymptomatic.  Discussed the potential to taper off of her medications.  She will let us know if she decides she wants to taper off of Wellbutrin first.

## 2019-08-19 NOTE — Assessment & Plan Note (Signed)
Check BMP .  

## 2019-08-19 NOTE — Assessment & Plan Note (Signed)
Plan to check at next visit.

## 2019-08-19 NOTE — Progress Notes (Signed)
Virtual Visit via video Note  This visit type was conducted due to national recommendations for restrictions regarding the COVID-19 pandemic (e.g. social distancing).  This format is felt to be most appropriate for this patient at this time.  All issues noted in this document were discussed and addressed.  No physical exam was performed (except for noted visual exam findings with Video Visits).   I connected with Lacie Scotts today at  2:15 PM EST by a video enabled telemedicine application or telephone and verified that I am speaking with the correct person using two identifiers. Location patient: work Location provider: work Persons participating in the virtual visit: patient, provider  I discussed the limitations, risks, security and privacy concerns of performing an evaluation and management service by telephone and the availability of in person appointments. I also discussed with the patient that there may be a patient responsible charge related to this service. The patient expressed understanding and agreed to proceed.  Reason for visit: follow-up.  HPI: Cold sores: Patient notes she uses Valtrex for these with good benefit.  She occasionally gets some issues out of the sun.  Depression: She denies depressive symptoms.  No SI.  She is taking Wellbutrin and Zoloft.  Elevated LDL: No chest pain or claudication.  ASCVD risk percentage is less than 5% on last check.  Restless legs: Some restless legs.  This has been going on for quite some time at night.  She was supposed to have a ferritin checked last year though did not follow through on that.  Bright red blood per rectum: She had one episode of that and saw GI.  They canceled her colonoscopy given the COVID-19 pandemic.   ROS: See pertinent positives and negatives per HPI.  Past Medical History:  Diagnosis Date  . CKD (chronic kidney disease) stage 2, GFR 60-89 ml/min   . Depression   . Hashimoto's disease   . Hyperlipidemia    . Migraines   . Obesity   . Vitamin D deficiency disease March 2016   11.7    Past Surgical History:  Procedure Laterality Date  . ABDOMINAL HYSTERECTOMY  09/2005   still has ovaries and cervix, needs paps    Family History  Problem Relation Age of Onset  . COPD Mother   . Hypertension Mother   . Osteoporosis Mother   . Hypertension Father   . Hyperlipidemia Brother     SOCIAL HX: Non-smoker   Current Outpatient Medications:  .  buPROPion (WELLBUTRIN SR) 100 MG 12 hr tablet, TAKE 1 TABLET BY MOUTH TWICE DAILY, Disp: 180 tablet, Rfl: 3 .  Cholecalciferol (VITAMIN D3) 2000 units capsule, Take 2,000 Units by mouth daily., Disp: , Rfl:  .  sertraline (ZOLOFT) 100 MG tablet, TAKE 1 TABLET BY MOUTH DAILY, Disp: 90 tablet, Rfl: 3 .  SUMAtriptan (IMITREX) 100 MG tablet, Take 100 mg by mouth every 2 (two) hours as needed for migraine. May repeat in 2 hours if headache persists or recurs., Disp: , Rfl:  .  valACYclovir (VALTREX) 1000 MG tablet, Take two tablets (2,000 mg) at first sign of outbreak, then two more pills twelve hours later; four pills per outbreak, Disp: 12 tablet, Rfl: 3  EXAM:  VITALS per patient if applicable:  GENERAL: alert, oriented, appears well and in no acute distress  HEENT: atraumatic, conjunttiva clear, no obvious abnormalities on inspection of external nose and ears  NECK: normal movements of the head and neck  LUNGS: on inspection no signs of  respiratory distress, breathing rate appears normal, no obvious gross SOB, gasping or wheezing  CV: no obvious cyanosis  MS: moves all visible extremities without noticeable abnormality  PSYCH/NEURO: pleasant and cooperative, no obvious depression or anxiety, speech and thought processing grossly intact  ASSESSMENT AND PLAN:  Discussed the following assessment and plan:  Bright red blood per rectum Patient had a single episode and no recurrence since last year.  She did not undergo screening colonoscopy.   We will plan on checking a CBC and a ferritin.  Depression Asymptomatic.  Discussed the potential to taper off of her medications.  She will let us know if she decides she wants to taper off of Wellbutrin first.  Hyperlipidemia Plan to check at next visit.  Restless legs Check ferritin level.  Recurrent cold sores Valtrex refilled.  CKD (chronic kidney disease) stage 2, GFR 60-89 ml/min Check BMP.   Orders Placed This Encounter  Procedures  . CBC    Standing Status:   Future    Standing Expiration Date:   08/18/2020  . IBC + Ferritin    Standing Status:   Future    Standing Expiration Date:   08/18/2020  . Basic Metabolic Panel (BMET)    Standing Status:   Future    Standing Expiration Date:   08/18/2020    Meds ordered this encounter  Medications  . valACYclovir (VALTREX) 1000 MG tablet    Sig: Take two tablets (2,000 mg) at first sign of outbreak, then two more pills twelve hours later; four pills per outbreak    Dispense:  12 tablet    Refill:  3     I discussed the assessment and treatment plan with the patient. The patient was provided an opportunity to ask questions and all were answered. The patient agreed with the plan and demonstrated an understanding of the instructions.   The patient was advised to call back or seek an in-person evaluation if the symptoms worsen or if the condition fails to improve as anticipated.   [name], MD

## 2019-08-19 NOTE — Assessment & Plan Note (Signed)
Check ferritin level. ?

## 2019-08-19 NOTE — Assessment & Plan Note (Signed)
Patient had a single episode and no recurrence since last year.  She did not undergo screening colonoscopy.  We will plan on checking a CBC and a ferritin.

## 2019-11-08 ENCOUNTER — Other Ambulatory Visit: Payer: Self-pay

## 2019-11-08 ENCOUNTER — Ambulatory Visit (INDEPENDENT_AMBULATORY_CARE_PROVIDER_SITE_OTHER): Payer: 59 | Admitting: Internal Medicine

## 2019-11-08 ENCOUNTER — Encounter: Payer: Self-pay | Admitting: Internal Medicine

## 2019-11-08 VITALS — BP 134/88 | HR 91 | Temp 98.1°F | Resp 15 | Ht 67.0 in | Wt 188.8 lb

## 2019-11-08 DIAGNOSIS — R300 Dysuria: Secondary | ICD-10-CM | POA: Diagnosis not present

## 2019-11-08 DIAGNOSIS — M546 Pain in thoracic spine: Secondary | ICD-10-CM | POA: Insufficient documentation

## 2019-11-08 LAB — POCT URINALYSIS DIPSTICK
Bilirubin, UA: NEGATIVE
Blood, UA: NEGATIVE
Glucose, UA: NEGATIVE
Ketones, UA: NEGATIVE
Leukocytes, UA: NEGATIVE
Nitrite, UA: NEGATIVE
Protein, UA: NEGATIVE
Spec Grav, UA: 1.015 (ref 1.010–1.025)
Urobilinogen, UA: 0.2 E.U./dL
pH, UA: 6 (ref 5.0–8.0)

## 2019-11-08 LAB — URINALYSIS, MICROSCOPIC ONLY: RBC / HPF: NONE SEEN (ref 0–?)

## 2019-11-08 MED ORDER — METHYLPREDNISOLONE ACETATE 40 MG/ML IJ SUSP
40.0000 mg | Freq: Once | INTRAMUSCULAR | Status: AC
  Administered 2019-11-08: 40 mg via INTRAMUSCULAR

## 2019-11-08 MED ORDER — HYDROCODONE-ACETAMINOPHEN 10-325 MG PO TABS: 1.0000 | ORAL_TABLET | Freq: Four times a day (QID) | ORAL | 0 refills | Status: DC | PRN

## 2019-11-08 MED ORDER — PREDNISONE 10 MG PO TABS
ORAL_TABLET | ORAL | 0 refills | Status: DC
Start: 2019-11-08 — End: 2020-05-05

## 2019-11-08 MED ORDER — TIZANIDINE HCL 4 MG PO TABS
4.0000 mg | ORAL_TABLET | Freq: Four times a day (QID) | ORAL | 0 refills | Status: DC | PRN
Start: 2019-11-08 — End: 2020-05-05

## 2019-11-08 NOTE — Patient Instructions (Signed)
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  It may make you sleepy   Hydrocodone for severe pain;  It may constipate you so keep Dulcolax handy

## 2019-11-08 NOTE — Progress Notes (Signed)
Subjective:  Patient ID: Patty Young, female    DOB: 1960-10-12  Age: 59 y.o. MRN: [id number]  CC: The primary encounter diagnosis was Dysuria. A diagnosis of Acute right-sided thoracic back pain was also pertinent to this visit.  HPI [name] presents for right sided flank pain  Without hematuria .  Started on Wednesday.  No history of fall or heavy lifting.  Works at a [employer], sport and exercise,  Some repetitive twisting but nothing else.  No numbness or tingling in legs   History of sciatica.  Using tylenol currently.  NSAIDs C/I due to CKD     Outpatient Medications Prior to Visit  Medication Sig Dispense Refill  . buPROPion (WELLBUTRIN SR) 100 MG 12 hr tablet TAKE 1 TABLET BY MOUTH TWICE DAILY 180 tablet 3  . Cholecalciferol (VITAMIN D3) 2000 units capsule Take 2,000 Units by mouth daily.    . Omega-3 Fatty Acids (FISH OIL) 1000 MG CAPS Take 1 capsule by mouth daily.    . sertraline (ZOLOFT) 100 MG tablet TAKE 1 TABLET BY MOUTH DAILY 90 tablet 3  . SUMAtriptan (IMITREX) 100 MG tablet Take 100 mg by mouth every 2 (two) hours as needed for migraine. May repeat in 2 hours if headache persists or recurs.    . valACYclovir (VALTREX) 1000 MG tablet Take two tablets (2,000 mg) at first sign of outbreak, then two more pills twelve hours later; four pills per outbreak 12 tablet 3   No facility-administered medications prior to visit.    Review of Systems;  Patient denies headache, fevers, malaise, unintentional weight loss, skin rash, eye pain, sinus congestion and sinus pain, sore throat, dysphagia,  hemoptysis , cough, dyspnea, wheezing, chest pain, palpitations, orthopnea, edema, abdominal pain, nausea, melena, diarrhea, constipation, flank pain, dysuria, hematuria, urinary  Frequency, nocturia, numbness, tingling, seizures,  Focal weakness, Loss of consciousness,  Tremor, insomnia, depression, anxiety, and suicidal ideation.      Objective:  BP 134/88 (BP Location: Left Arm, Patient  Position: Sitting, Cuff Size: Normal)   Pulse 91   Temp 98.1 F (36.7 C) (Temporal)   Resp 15   Ht 5\' 7"  (1.702 m)   Wt 188 lb 12.8 oz (85.6 kg)   SpO2 99%   BMI 29.57 kg/m   BP Readings from Last 3 Encounters:  11/08/19 134/88  04/16/18 110/76  10/16/17 118/80    Wt Readings from Last 3 Encounters:  11/08/19 188 lb 12.8 oz (85.6 kg)  08/19/19 182 lb (82.6 kg)  04/16/18 183 lb 3.2 oz (83.1 kg)    General appearance: alert, cooperative and appears stated age Ears: normal TM's and external ear canals both ears Throat: lips, mucosa, and tongue normal; teeth and gums normal Neck: no adenopathy, no carotid bruit, supple, symmetrical, trachea midline and thyroid not enlarged, symmetric, no tenderness/mass/nodules Back: symmetric, no curvature. ROM normal. No CVA tenderness. Lungs: clear to auscultation bilaterally Heart: regular rate and rhythm, S1, S2 normal, no murmur, click, rub or gallop Abdomen: soft, non-tender; bowel sounds normal; no masses,  no organomegaly Pulses: 2+ and symmetric Skin: Skin color, texture, turgor normal. No rashes or lesions BAck:  ROM restricted to flexion to 120 degrees due to pain.  No vertebral point tenderness.  Right sided paraspinus muscle spasm  Exquisitely tender to light palpation,  No bruising or redness  Lymph nodes: Cervical, supraclavicular, and axillary nodes normal.  No results found for: HGBA1C  Lab Results  Component Value Date   CREATININE 0.98 11/27/2018  CREATININE 1.01 10/16/2017   CREATININE 1.05 04/14/2017    Lab Results  Component Value Date   GLUCOSE 83 11/27/2018   CHOL 239 (H) 11/27/2018   TRIG 71.0 11/27/2018   HDL 56.50 11/27/2018   LDLDIRECT 161.0 04/16/2018   LDLCALC 168 (H) 11/27/2018   ALT 21 11/27/2018   AST 15 11/27/2018   NA 138 11/27/2018   K 4.5 11/27/2018   CL 102 11/27/2018   CREATININE 0.98 11/27/2018   BUN 16 11/27/2018   CO2 29 11/27/2018   TSH 1.11 11/27/2018   MICROALBUR 0.9 04/14/2017      US Renal  Result Date: 04/27/2017 CLINICAL DATA:  Chronic renal disease. EXAM: RENAL / URINARY TRACT ULTRASOUND COMPLETE COMPARISON:  None. FINDINGS: Right Kidney: Length: 9.6 cm. Renal cortical irregularity consistent with scarring. Renal cortical thinning. Echogenicity within normal limits. No mass or hydronephrosis visualized. Left Kidney: Length: 10.8 cm. Renal cortical irregularity consistent with scarring . Renal cortical thinning. Echogenicity within normal limits. 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 No mass or hydronephrosis visualized. Bladder: Appears normal for degree of bladder distention. IMPRESSION: 1. Bilateral renal cortical irregularity consistent with scarring. Bilateral renal cortical thinning. 2. No acute abnormality.  No hydronephrosis or bladder distention. Electronically Signed   By: Marcello Moores  Register   On: 04/27/2017 07:35    Assessment & Plan:   Problem List Items Addressed This Visit      Unprioritized   Acute right-sided thoracic back pain    Treating for muscle spasm : prednisone taper (has CKD). teylnol , MR< and prm vicocin       Relevant Medications   tiZANidine (ZANAFLEX) 4 MG tablet   predniSONE (DELTASONE) 10 MG tablet   HYDROcodone-acetaminophen (NORCO) 10-325 MG tablet    Other Visit Diagnoses    Dysuria    -  Primary   Relevant Orders   POCT Urinalysis Dipstick (Completed)   Urine Culture   Urine Microscopic Only      I provided  30 minutes of  face-to-face time during this encounter reviewing patient's current problems and past surgeries, labs and imaging studies, providing counseling on the above mentioned problems , and coordination  of care .   I am having Karson M. Picazo start on tiZANidine, predniSONE, and HYDROcodone-acetaminophen. I am also having her maintain her SUMAtriptan, Vitamin D3, sertraline, buPROPion, valACYclovir, and Fish Oil. We administered methylPREDNISolone acetate.  Meds ordered this encounter   Medications  . tiZANidine (ZANAFLEX) 4 MG tablet    Sig: Take 1 tablet (4 mg total) by mouth every 6 (six) hours as needed for muscle spasms.    Dispense:  30 tablet    Refill:  0  . predniSONE (DELTASONE) 10 MG tablet    Sig: 6 tablets on Day 1 , then reduce by 1 tablet daily until gone    Dispense:  21 tablet    Refill:  0  . HYDROcodone-acetaminophen (NORCO) 10-325 MG tablet    Sig: Take 1 tablet by mouth every 6 (six) hours as needed.    Dispense:  30 tablet    Refill:  0  . methylPREDNISolone acetate (DEPO-MEDROL) injection 40 mg    There are no discontinued medications.  Follow-up: No follow-ups on file.   Crecencio Mc, MD

## 2019-11-08 NOTE — Assessment & Plan Note (Signed)
Treating for muscle spasm : prednisone taper (has CKD). teylnol , MR< and prm vicocin

## 2019-11-09 LAB — URINE CULTURE
MICRO NUMBER:: 10479070
Result:: NO GROWTH
SPECIMEN QUALITY:: ADEQUATE

## 2019-12-12 ENCOUNTER — Telehealth: Payer: Self-pay | Admitting: Family Medicine

## 2019-12-12 DIAGNOSIS — Z1231 Encounter for screening mammogram for malignant neoplasm of breast: Secondary | ICD-10-CM

## 2019-12-12 NOTE — Telephone Encounter (Signed)
I called and LVM for the patient to schedule a Mammogram that is due.  Cire Clute,cma

## 2019-12-12 NOTE — Telephone Encounter (Signed)
I received a reminder that the patient is due for a mammogram. Please contact her and advise her to call to schedule this.

## 2020-01-13 ENCOUNTER — Other Ambulatory Visit: Payer: Self-pay | Admitting: Family Medicine

## 2020-02-13 ENCOUNTER — Other Ambulatory Visit: Payer: Self-pay | Admitting: Family Medicine

## 2020-03-04 ENCOUNTER — Encounter: Payer: Self-pay | Admitting: Family Medicine

## 2020-03-10 ENCOUNTER — Other Ambulatory Visit (INDEPENDENT_AMBULATORY_CARE_PROVIDER_SITE_OTHER): Payer: 59

## 2020-03-10 ENCOUNTER — Other Ambulatory Visit: Payer: Self-pay

## 2020-03-10 DIAGNOSIS — K625 Hemorrhage of anus and rectum: Secondary | ICD-10-CM | POA: Diagnosis not present

## 2020-03-10 DIAGNOSIS — N182 Chronic kidney disease, stage 2 (mild): Secondary | ICD-10-CM | POA: Diagnosis not present

## 2020-03-10 DIAGNOSIS — G2581 Restless legs syndrome: Secondary | ICD-10-CM | POA: Diagnosis not present

## 2020-03-10 LAB — BASIC METABOLIC PANEL
BUN: 10 mg/dL (ref 6–23)
CO2: 28 mEq/L (ref 19–32)
Calcium: 9.2 mg/dL (ref 8.4–10.5)
Chloride: 102 mEq/L (ref 96–112)
Creatinine, Ser: 1.02 mg/dL (ref 0.40–1.20)
GFR: 55.84 mL/min — ABNORMAL LOW (ref >60.00)
Glucose, Bld: 78 mg/dL (ref 70–99)
Potassium: 3.9 mEq/L (ref 3.5–5.1)
Sodium: 138 mEq/L (ref 135–145)

## 2020-03-10 LAB — CBC
HCT: 40.2 % (ref 36.0–46.0)
Hemoglobin: 13.3 g/dL (ref 12.0–15.0)
MCHC: 33.1 g/dL (ref 30.0–36.0)
MCV: 90.8 fl (ref 78.0–100.0)
Platelets: 286 10*3/uL (ref 150.0–400.0)
RBC: 4.43 Mil/uL (ref 3.87–5.11)
RDW: 13.2 % (ref 11.5–15.5)
WBC: 5.8 10*3/uL (ref 4.0–10.5)

## 2020-03-10 LAB — IBC + FERRITIN
Ferritin: 49 ng/mL (ref 10.0–291.0)
Iron: 93 ug/dL (ref 42–145)
Saturation Ratios: 22.1 % (ref 20.0–50.0)
Transferrin: 300 mg/dL (ref 212.0–360.0)

## 2020-03-21 ENCOUNTER — Other Ambulatory Visit: Payer: Self-pay | Admitting: Family Medicine

## 2020-03-21 DIAGNOSIS — K625 Hemorrhage of anus and rectum: Secondary | ICD-10-CM

## 2020-03-23 ENCOUNTER — Encounter: Payer: Self-pay | Admitting: *Deleted

## 2020-04-02 ENCOUNTER — Telehealth: Payer: Self-pay | Admitting: Family Medicine

## 2020-04-02 NOTE — Telephone Encounter (Signed)
msg sent from [facility]   Patient not ready to schedule yet, please send her a reminder.

## 2020-04-02 NOTE — Telephone Encounter (Signed)
Noted  

## 2020-04-28 ENCOUNTER — Ambulatory Visit: Payer: 59 | Admitting: Family Medicine

## 2020-05-05 ENCOUNTER — Ambulatory Visit: Payer: 59 | Admitting: Family Medicine

## 2020-05-05 ENCOUNTER — Encounter: Payer: Self-pay | Admitting: Family Medicine

## 2020-05-05 ENCOUNTER — Ambulatory Visit (INDEPENDENT_AMBULATORY_CARE_PROVIDER_SITE_OTHER): Payer: 59 | Admitting: Family Medicine

## 2020-05-05 ENCOUNTER — Ambulatory Visit
Admission: RE | Admit: 2020-05-05 | Discharge: 2020-05-05 | Disposition: A | Discharge status: Home / Self Care | Payer: 59 | Source: Ambulatory Visit | Attending: Family Medicine | Admitting: Family Medicine

## 2020-05-05 ENCOUNTER — Other Ambulatory Visit: Payer: Self-pay

## 2020-05-05 VITALS — BP 122/78 | HR 104 | Temp 98.4°F | Ht 67.0 in | Wt 190.0 lb

## 2020-05-05 DIAGNOSIS — K625 Hemorrhage of anus and rectum: Secondary | ICD-10-CM

## 2020-05-05 DIAGNOSIS — G2581 Restless legs syndrome: Secondary | ICD-10-CM

## 2020-05-05 DIAGNOSIS — M21619 Bunion of unspecified foot: Secondary | ICD-10-CM | POA: Diagnosis not present

## 2020-05-05 DIAGNOSIS — Z23 Encounter for immunization: Secondary | ICD-10-CM

## 2020-05-05 DIAGNOSIS — F325 Major depressive disorder, single episode, in full remission: Secondary | ICD-10-CM | POA: Diagnosis not present

## 2020-05-05 DIAGNOSIS — Z1231 Encounter for screening mammogram for malignant neoplasm of breast: Secondary | ICD-10-CM | POA: Insufficient documentation

## 2020-05-05 DIAGNOSIS — N1831 Chronic kidney disease, stage 3a: Secondary | ICD-10-CM | POA: Diagnosis not present

## 2020-05-05 NOTE — Assessment & Plan Note (Signed)
I encouraged her to follow through with her colonoscopy in January.  She understands the need for this.

## 2020-05-05 NOTE — Assessment & Plan Note (Signed)
Denies symptoms.  She will continue Wellbutrin 100 mg twice daily and Zoloft 100 mg daily. 

## 2020-05-05 NOTE — Assessment & Plan Note (Signed)
I encouraged her to start on an iron supplement given her ferritin is less than 75.

## 2020-05-05 NOTE — Assessment & Plan Note (Signed)
Discussed this based on lab work.  Advised to avoid NSAIDs.

## 2020-05-05 NOTE — Progress Notes (Signed)
  [name], MD Phone: [phone]  Patty Young is a 59 y.o. female who presents today for follow-up.  Anxiety/depression: Patient denies symptoms.  Wellbutrin and Zoloft have been beneficial.  Bright red blood per rectum: No recurrence.  She notes she is going to schedule her colonoscopy in January.  She did not want to go during the height of Covid.  No family history of colon cancer.  Right foot bump: Patient notes a small red bump on the dorsal lateral aspect of her first MTP joint.  Throbs at times depending on what shoes she is wears.  Her family has a history of similar issues with bunions.  Social History   Tobacco Use  Smoking Status Never Smoker  Smokeless Tobacco Never Used     ROS see history of present illness  Objective  Physical Exam Vitals:   05/05/20 1418  BP: 122/78  Pulse: (!) 104  Temp: 98.4 F (36.9 C)  SpO2: 98%    BP Readings from Last 3 Encounters:  05/05/20 122/78  11/08/19 134/88  04/16/18 110/76   Wt Readings from Last 3 Encounters:  05/05/20 190 lb (86.2 kg)  11/08/19 188 lb 12.8 oz (85.6 kg)  08/19/19 182 lb (82.6 kg)    Physical Exam Constitutional:      General: She is not in acute distress.    Appearance: She is not diaphoretic.  Cardiovascular:     Rate and Rhythm: Normal rate and regular rhythm.     Heart sounds: Normal heart sounds.  Pulmonary:     Effort: Pulmonary effort is normal.     Breath sounds: Normal breath sounds.  Musculoskeletal:       Feet:  Skin:    General: Skin is warm and dry.  Neurological:     Mental Status: She is alert.      Assessment/Plan: Please see individual problem list.  Problem List Items Addressed This Visit    Bright red blood per rectum    I encouraged her to follow through with her colonoscopy in January.  She understands the need for this.      Bunion    Suspect early bunion.  Discussed bunion pads and changing shoes.      Chronic kidney disease (CKD), stage III  (moderate) (HCC)    Discussed this based on lab work.  Advised to avoid NSAIDs.      Depression    Denies symptoms.  She will continue Wellbutrin 100 mg twice daily and Zoloft 100 mg daily.      Restless legs    I encouraged her to start on an iron supplement given her ferritin is less than 75.       Other Visit Diagnoses    Need for immunization against influenza    -  Primary   Relevant Orders   Flu Vaccine QUAD 36+ mos IM (Completed)      This visit occurred during the SARS-CoV-2 public health emergency.  Safety protocols were in place, including screening questions prior to the visit, additional usage of staff PPE, and extensive cleaning of exam room while observing appropriate contact time as indicated for disinfecting solutions.    [name], MD [facility]

## 2020-05-05 NOTE — Assessment & Plan Note (Signed)
Suspect early bunion.  Discussed bunion pads and changing shoes.

## 2020-05-05 NOTE — Patient Instructions (Signed)
Nice to see you. Please change shoes and try bunion pads. Please avoid NSAIDs such as ibuprofen and Aleve. Please try an iron supplement to see if that helps with your restless legs.

## 2020-05-07 ENCOUNTER — Other Ambulatory Visit: Payer: Self-pay | Admitting: Family Medicine

## 2020-05-07 DIAGNOSIS — R928 Other abnormal and inconclusive findings on diagnostic imaging of breast: Secondary | ICD-10-CM

## 2020-05-07 DIAGNOSIS — N6489 Other specified disorders of breast: Secondary | ICD-10-CM

## 2020-06-15 ENCOUNTER — Ambulatory Visit: Payer: 59

## 2020-06-15 ENCOUNTER — Other Ambulatory Visit: Payer: 59

## 2020-06-22 ENCOUNTER — Other Ambulatory Visit: Payer: Self-pay

## 2020-06-22 ENCOUNTER — Ambulatory Visit
Admission: RE | Admit: 2020-06-22 | Discharge: 2020-06-22 | Disposition: A | Discharge status: Home / Self Care | Payer: 59 | Source: Ambulatory Visit | Attending: Family Medicine | Admitting: Family Medicine

## 2020-06-22 DIAGNOSIS — R928 Other abnormal and inconclusive findings on diagnostic imaging of breast: Secondary | ICD-10-CM

## 2020-06-22 DIAGNOSIS — N6489 Other specified disorders of breast: Secondary | ICD-10-CM | POA: Diagnosis present

## 2020-06-23 ENCOUNTER — Other Ambulatory Visit: Payer: Self-pay | Admitting: Family Medicine

## 2020-06-23 ENCOUNTER — Telehealth: Payer: Self-pay

## 2020-06-23 DIAGNOSIS — R928 Other abnormal and inconclusive findings on diagnostic imaging of breast: Secondary | ICD-10-CM

## 2020-06-23 NOTE — Telephone Encounter (Signed)
-----   Message from Glori Luis, MD sent at 06/23/2020 10:34 AM EST ----- Orders signed for biopsy. Please follow-up with the patient to see if she has any questions about her diagnostic mammogram result and to make sure that she schedules her biopsy appointment.

## 2020-06-24 NOTE — Telephone Encounter (Signed)
I called and spoke with the patient and informed her that she is to call [facility] and schedule her biopsy.  Param Capri,cma

## 2020-06-24 NOTE — Telephone Encounter (Signed)
Pt returned your call. She has questions she about who she needs to call regarding the biopsy. She thought we were going to schedule the biopsy.

## 2020-07-02 ENCOUNTER — Ambulatory Visit
Admission: RE | Admit: 2020-07-02 | Discharge: 2020-07-02 | Disposition: A | Discharge status: Home / Self Care | Payer: 59 | Source: Ambulatory Visit | Attending: Family Medicine | Admitting: Family Medicine

## 2020-07-02 ENCOUNTER — Other Ambulatory Visit: Payer: Self-pay

## 2020-07-02 DIAGNOSIS — R928 Other abnormal and inconclusive findings on diagnostic imaging of breast: Secondary | ICD-10-CM

## 2020-07-02 HISTORY — PX: BREAST BIOPSY: SHX20

## 2020-07-03 LAB — SURGICAL PATHOLOGY

## 2020-08-10 ENCOUNTER — Telehealth: Payer: 59 | Admitting: Emergency Medicine

## 2020-08-10 DIAGNOSIS — R0981 Nasal congestion: Secondary | ICD-10-CM

## 2020-08-10 DIAGNOSIS — U071 COVID-19: Secondary | ICD-10-CM

## 2020-08-10 MED ORDER — IPRATROPIUM BROMIDE 0.03 % NA SOLN: 2.0000 | Freq: Two times a day (BID) | NASAL | 0 refills | Status: DC

## 2020-08-10 MED ORDER — BENZONATATE 100 MG PO CAPS: 100.0000 mg | ORAL_CAPSULE | Freq: Two times a day (BID) | ORAL | 0 refills | Status: DC | PRN

## 2020-08-10 NOTE — Progress Notes (Signed)
E-Visit for Corona Virus Screening  We are sorry you are not feeling well. We are here to help!  You have tested positive for COVID-19, meaning that you were infected with the novel coronavirus and could give the virus to others.  It is vitally important that you stay home so you do not spread it to others.      Your symptoms are consistent with your reported positive COVID test.  There are a few treatment options, which I've listed below.  Antibiotics are not indicated in COVID-19 infections.  Your symptoms could last another 7-10 days, but the treatments below may help.  Please continue isolation at home, for at least 10 days since the start of your symptoms and until you have had 24 hours with no fever (without taking a fever reducer) and with improving of symptoms.  If you have no symptoms but tested positive (or all symptoms resolve after 5 days and you have no fever) you can leave your house but continue to wear a mask around others for an additional 5 days. If you have a fever,continue to stay home until you have had 24 hours of no fever. Most cases improve 5-10 days from onset but we have seen a small number of patients who have gotten worse after the 10 days.  Please be sure to watch for worsening symptoms and remain taking the proper precautions.   Go to the nearest hospital ED for assessment if fever/cough/breathlessness are severe or illness seems like a threat to life.    The following symptoms may appear 2-14 days after exposure: . Fever . Cough . Shortness of breath or difficulty breathing . Chills . Repeated shaking with chills . Muscle pain . Headache . Sore throat . New loss of taste or smell . Fatigue . Congestion or runny nose . Nausea or vomiting . Diarrhea  You have been enrolled in [facility] Monitoring for COVID-19. Daily you will receive a questionnaire within the MyChart website. Our COVID-19 response team will be monitoring your responses daily.  You can  use medication such as: 1. A prescription cough medication called Tessalon Perles 100 mg. You may take 1-2 capsules every 8 hours as needed for cough  2. Atrovent nasal spray, which should help decrease the congestion and head pressure.   You may also take acetaminophen (Tylenol) as needed for fever.  HOME CARE: . Only take medications as instructed by your medical team. . Drink plenty of fluids and get plenty of rest. . A steam or ultrasonic humidifier can help if you have congestion.   GET HELP RIGHT AWAY IF YOU HAVE EMERGENCY WARNING SIGNS.  Call 911 or proceed to your closest emergency facility if: . You develop worsening high fever. . Trouble breathing . Bluish lips or face . Persistent pain or pressure in the chest . New confusion . Inability to wake or stay awake . You cough up blood. . Your symptoms become more severe . Inability to hold down food or fluids  This list is not all possible symptoms. Contact your medical provider for any symptoms that are severe or concerning to you.    Your e-visit answers were reviewed by a board certified advanced clinical practitioner to complete your personal care plan.  Depending on the condition, your plan could have included both over the counter or prescription medications.  If there is a problem please reply once you have received a response from your provider.  Your safety is important to [place].  If you have drug allergies check your prescription carefully.    You can use MyChart to ask questions about today's visit, request a non-urgent call back, or ask for a work or school excuse for 24 hours related to this e-Visit. If it has been greater than 24 hours you will need to follow up with your provider, or enter a new e-Visit to address those concerns. You will get an e-mail in the next two days asking about your experience.  I hope that your e-visit has been valuable and will speed your recovery. Thank you for using  e-visits.      Approximately 5 minutes was used in reviewing the patient's chart, questionnaire, prescribing medications, and documentation.

## 2020-10-15 ENCOUNTER — Other Ambulatory Visit: Payer: Self-pay | Admitting: Family Medicine

## 2020-10-28 ENCOUNTER — Other Ambulatory Visit: Payer: Self-pay

## 2020-11-03 ENCOUNTER — Other Ambulatory Visit: Payer: Self-pay

## 2020-11-03 ENCOUNTER — Encounter: Payer: Self-pay | Admitting: Family Medicine

## 2020-11-03 ENCOUNTER — Ambulatory Visit (INDEPENDENT_AMBULATORY_CARE_PROVIDER_SITE_OTHER): Payer: 59 | Admitting: Family Medicine

## 2020-11-03 VITALS — BP 110/80 | HR 69 | Temp 98.5°F | Ht 67.0 in | Wt 184.6 lb

## 2020-11-03 DIAGNOSIS — F325 Major depressive disorder, single episode, in full remission: Secondary | ICD-10-CM | POA: Diagnosis not present

## 2020-11-03 DIAGNOSIS — E785 Hyperlipidemia, unspecified: Secondary | ICD-10-CM | POA: Diagnosis not present

## 2020-11-03 DIAGNOSIS — Z8616 Personal history of COVID-19: Secondary | ICD-10-CM | POA: Diagnosis not present

## 2020-11-03 DIAGNOSIS — K625 Hemorrhage of anus and rectum: Secondary | ICD-10-CM | POA: Diagnosis not present

## 2020-11-03 DIAGNOSIS — E663 Overweight: Secondary | ICD-10-CM | POA: Diagnosis not present

## 2020-11-03 DIAGNOSIS — Z23 Encounter for immunization: Secondary | ICD-10-CM

## 2020-11-03 LAB — LIPID PANEL
Cholesterol: 227 mg/dL — ABNORMAL HIGH (ref 0–200)
HDL: 53.3 mg/dL (ref >39.00)
LDL Cholesterol: 150 mg/dL — ABNORMAL HIGH (ref 0–99)
NonHDL: 174.03
Total CHOL/HDL Ratio: 4
Triglycerides: 121 mg/dL (ref 0.0–149.0)
VLDL: 24.2 mg/dL (ref 0.0–40.0)

## 2020-11-03 LAB — COMPREHENSIVE METABOLIC PANEL
ALT: 19 U/L (ref 0–35)
AST: 17 U/L (ref 0–37)
Albumin: 4.4 g/dL (ref 3.5–5.2)
Alkaline Phosphatase: 81 U/L (ref 39–117)
BUN: 13 mg/dL (ref 6–23)
CO2: 30 mEq/L (ref 19–32)
Calcium: 9.4 mg/dL (ref 8.4–10.5)
Chloride: 101 mEq/L (ref 96–112)
Creatinine, Ser: 0.96 mg/dL (ref 0.40–1.20)
GFR: 65.57 mL/min (ref >60.00)
Glucose, Bld: 90 mg/dL (ref 70–99)
Potassium: 4 mEq/L (ref 3.5–5.1)
Sodium: 137 mEq/L (ref 135–145)
Total Bilirubin: 0.5 mg/dL (ref 0.2–1.2)
Total Protein: 7.1 g/dL (ref 6.0–8.3)

## 2020-11-03 LAB — HEMOGLOBIN A1C: Hgb A1c MFr Bld: 5.7 % (ref 4.6–6.5)

## 2020-11-03 NOTE — Assessment & Plan Note (Signed)
Discussed the need for colonoscopy.  Referral to GI placed.

## 2020-11-03 NOTE — Assessment & Plan Note (Signed)
The patient has fully recovered. °

## 2020-11-03 NOTE — Patient Instructions (Signed)
Nice to see you. Please try to add in some exercise with walking for 20 to 30 minutes 3 days a week. We will get labs today and contact you with the results.

## 2020-11-03 NOTE — Progress Notes (Signed)
Tommi Rumps, MD Phone: [phone]  Patty Young is a 60 y.o. female who presents today for f/u.  Depression: Patient notes no depression currently.  No anxiety.  She continues on Wellbutrin and Zoloft.  No SI.  Overweight: She has not been exercising.  She does eat relatively healthily.  Has cereal and a banana for breakfast.  Typically leftovers for lunch.  Sometimes has premade dinners.  Eats fruits and vegetables.  Minimal soda or sweet tea.  History of COVID-19: Several months ago she had a respiratory illness and took a home COVID test that was positive.  She notes the cough was the worst thing she dealt with.  She has fully recovered.  History of bright red blood per rectum: Patient notes no recurrent bleeding.  She has not undergone a colonoscopy.  Social History   Tobacco Use  Smoking Status Never Smoker  Smokeless Tobacco Never Used    Current Outpatient Medications on File Prior to Visit  Medication Sig Dispense Refill  . benzonatate (TESSALON) 100 MG capsule Take 1 capsule (100 mg total) by mouth 2 (two) times daily as needed for cough. 20 capsule 0  . buPROPion (WELLBUTRIN SR) 100 MG 12 hr tablet TAKE 1 TABLET BY MOUTH TWICE A DAY 180 tablet 2  . Cholecalciferol (VITAMIN D3) 2000 units capsule Take 2,000 Units by mouth daily.    Marland Kitchen ipratropium (ATROVENT) 0.03 % nasal spray Place 2 sprays into both nostrils every 12 (twelve) hours. 30 mL 0  . Omega-3 Fatty Acids (FISH OIL) 1000 MG CAPS Take 1 capsule by mouth daily.    . sertraline (ZOLOFT) 100 MG tablet TAKE 1 TABLET BY MOUTH EVERY DAY 90 tablet 2  . valACYclovir (VALTREX) 1000 MG tablet TAKE 2 TABLETS BY MOUTH AT 1ST SIGN OF OUTBREAK, THEN 2 MORE 12 HOURS LATER. (4 PER OUTBREAK) 12 tablet 3   No current facility-administered medications on file prior to visit.     ROS see history of present illness  Objective  Physical Exam Vitals:   11/03/20 0828  BP: 110/80  Pulse: 69  Temp: 98.5 F (36.9 C)  SpO2:  99%    BP Readings from Last 3 Encounters:  11/03/20 110/80  05/05/20 122/78  11/08/19 134/88   Wt Readings from Last 3 Encounters:  11/03/20 184 lb 9.6 oz (83.7 kg)  05/05/20 190 lb (86.2 kg)  11/08/19 188 lb 12.8 oz (85.6 kg)    Physical Exam Constitutional:      General: She is not in acute distress.    Appearance: She is not diaphoretic.  Cardiovascular:     Rate and Rhythm: Normal rate and regular rhythm.     Heart sounds: Normal heart sounds.  Pulmonary:     Effort: Pulmonary effort is normal.     Breath sounds: Normal breath sounds.  Skin:    General: Skin is warm and dry.  Neurological:     Mental Status: She is alert.      Assessment/Plan: Please see individual problem list.  Problem List Items Addressed This Visit    Depression - Primary    Denies symptoms.  She will continue Wellbutrin 100 mg twice daily and Zoloft 100 mg daily.      Hyperlipidemia    Check lipid panel      Relevant Orders   Comp Met (CMET)   Lipid panel   Bright red blood per rectum    Discussed the need for colonoscopy.  Referral to GI placed.  Relevant Orders   Ambulatory referral to Gastroenterology   Overweight    Encouraged adding in exercise with walking 20 to 30 minutes 3 times a week.  Discussed avoiding sugary drinks.  Encouraged adequate vegetable and fruit intake.      Relevant Orders   HgB A1c   History of COVID-19    The patient has fully recovered.       Other Visit Diagnoses    Need for tetanus booster       Relevant Orders   Td : Tetanus/diphtheria >7yo Preservative  free (Completed)       Health Maintenance: The patient is due for Pap smear.  She will return in the next month or so for this to be completed.  Tetanus vaccine given today.  Return in about 1 month (around 12/04/2020) for pap smear.  This visit occurred during the SARS-CoV-2 public health emergency.  Safety protocols were in place, including screening questions prior to the visit,  additional usage of staff PPE, and extensive cleaning of exam room while observing appropriate contact time as indicated for disinfecting solutions.    Tommi Rumps, MD [place]

## 2020-11-03 NOTE — Assessment & Plan Note (Signed)
Encouraged adding in exercise with walking 20 to 30 minutes 3 times a week.  Discussed avoiding sugary drinks.  Encouraged adequate vegetable and fruit intake.

## 2020-11-03 NOTE — Assessment & Plan Note (Signed)
Check lipid panel  

## 2020-11-03 NOTE — Assessment & Plan Note (Signed)
Denies symptoms.  She will continue Wellbutrin 100 mg twice daily and Zoloft 100 mg daily.

## 2020-11-16 ENCOUNTER — Encounter: Payer: Self-pay | Admitting: Gastroenterology

## 2020-12-10 ENCOUNTER — Encounter: Payer: Self-pay | Admitting: Family Medicine

## 2020-12-11 ENCOUNTER — Ambulatory Visit: Payer: 59 | Admitting: Family Medicine

## 2020-12-16 MED ORDER — TIZANIDINE HCL 4 MG PO TABS: 4.0000 mg | ORAL_TABLET | Freq: Four times a day (QID) | ORAL | 0 refills | Status: DC | PRN

## 2021-03-16 IMAGING — MG MM BREAST LOCALIZATION CLIP
4 series · 4 of 12 positions shown · non-contrast
Comparison: Previous exam(s).

CLINICAL DATA: Status post stereotactic guided biopsy

EXAM:
DIAGNOSTIC RIGHT MAMMOGRAM POST STEREOTACTIC BIOPSY

[R CC synth-2D]
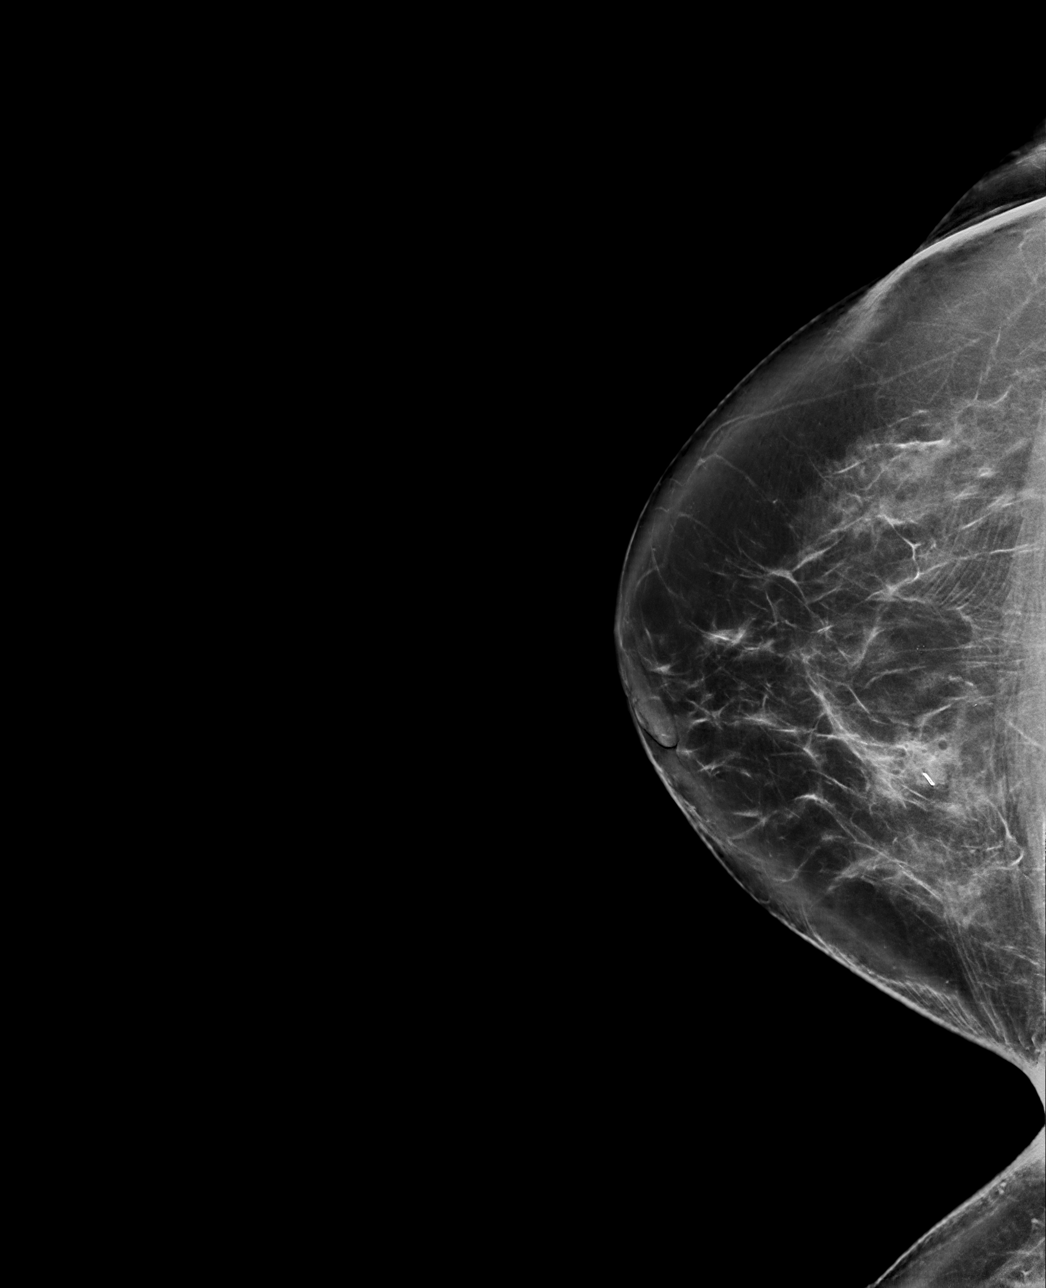

[R ML synth-2D]
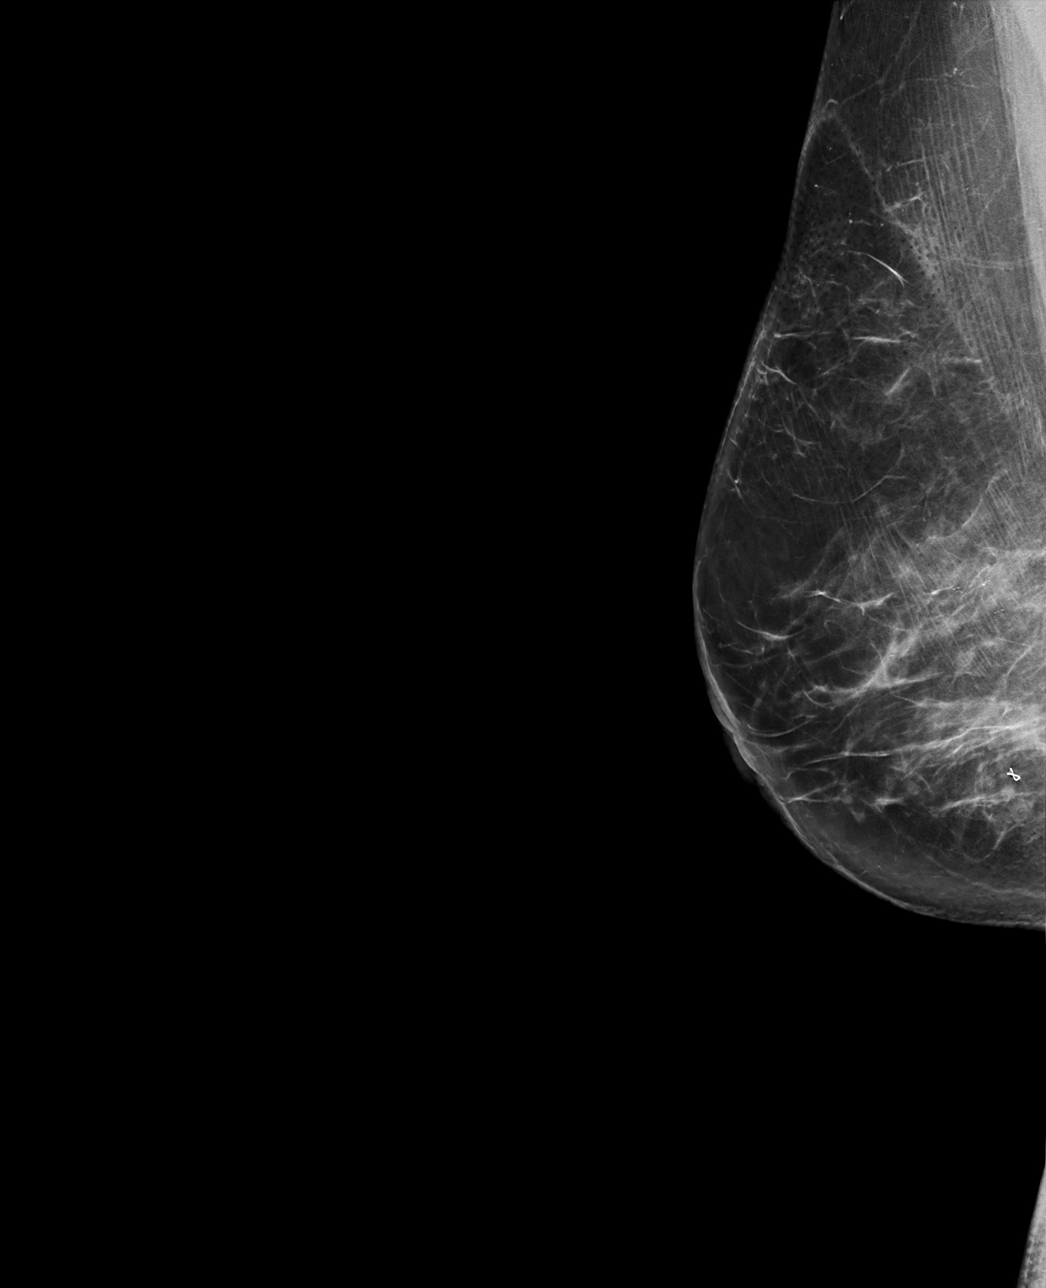

[R CC tomo · tomo slice 45/90.0]
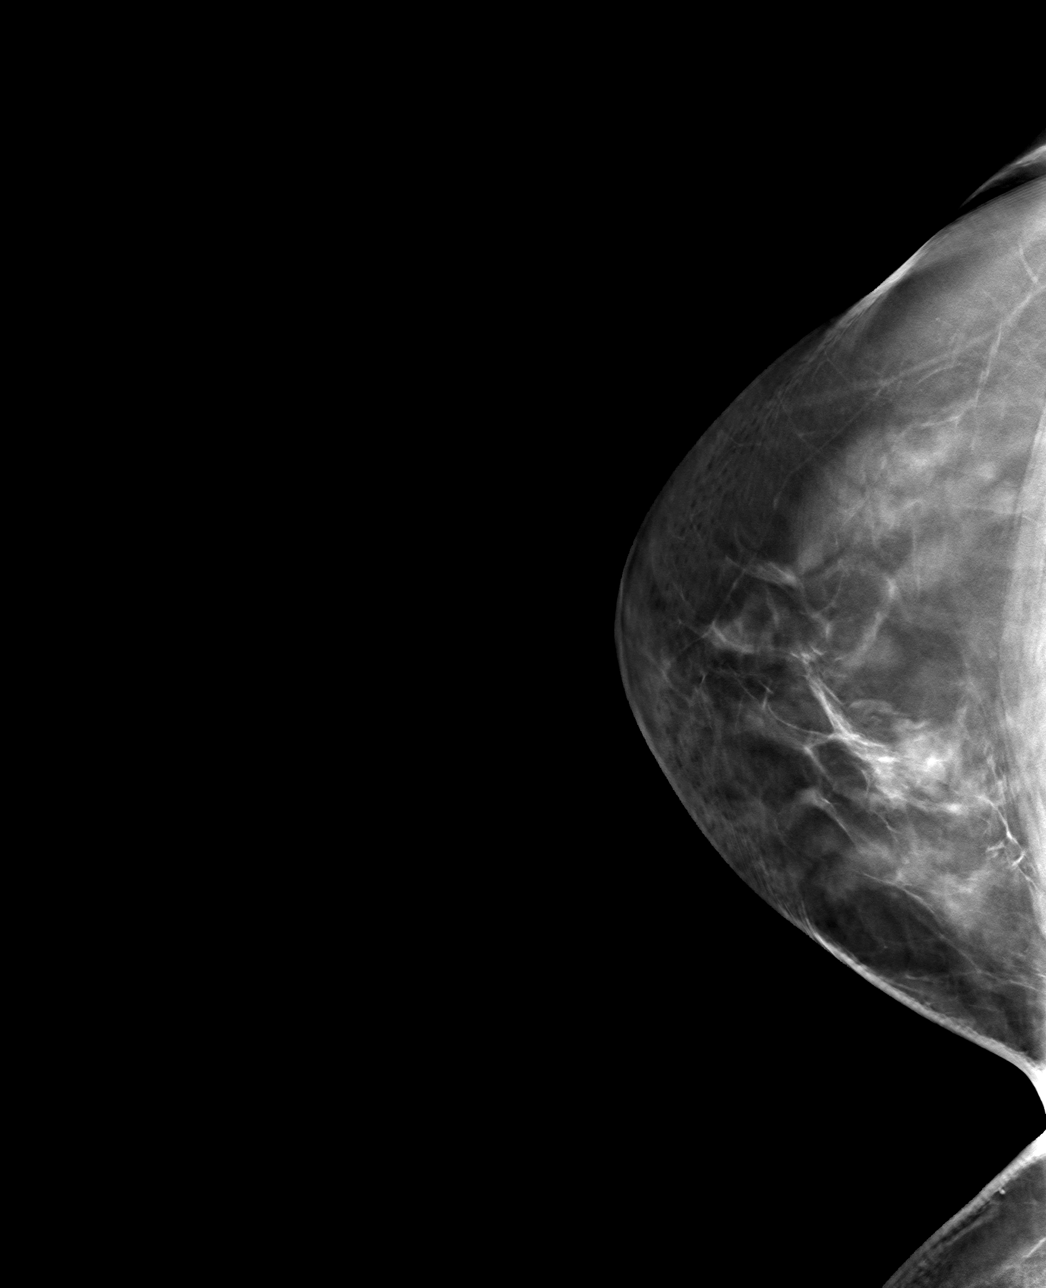

[R ML tomo · tomo slice 42/83.0]
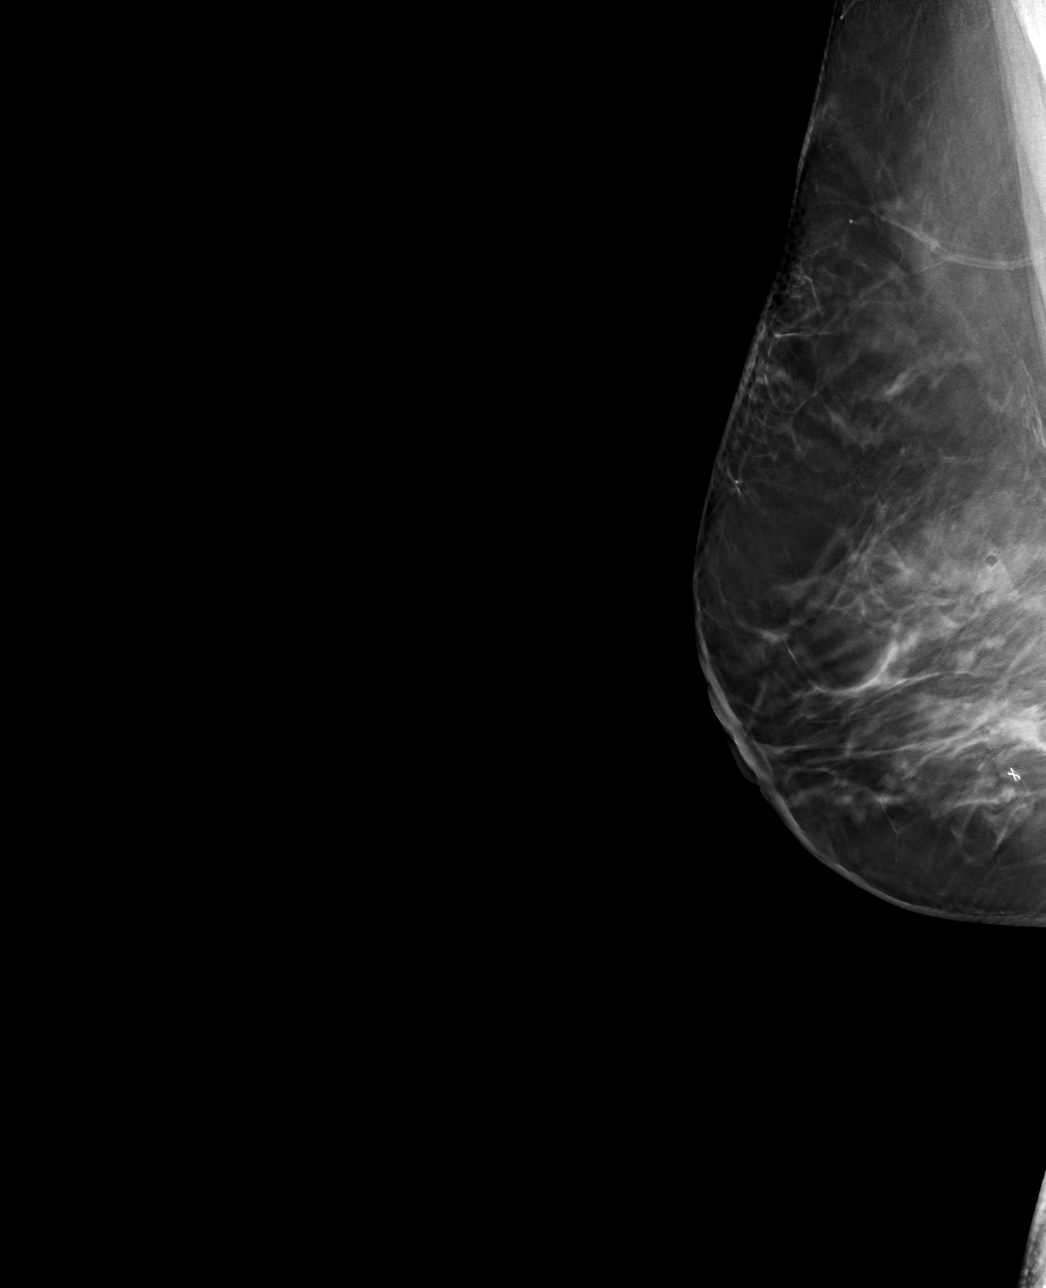

[4 of 12 positions shown; findings below may reference images not displayed]

FINDINGS: Mammographic images were obtained following stereotactic guided
biopsy of RIGHT breast architectural distortion. The RIBBON biopsy
marking clip is displaced 5 mm inferiorly.
IMPRESSION: The RIBBON shaped biopsy marking clip is displaced approximately 5
mm inferiorly from the site of biopsy in the the RIGHT lower inner
breast.

Final Assessment: Post Procedure Mammograms for Marker Placement

## 2021-03-16 IMAGING — MG MM BREAST BX W/ LOC DEV 1ST LESION IMAGE BX SPEC STEREO GUIDE*R*
8 of 11 series · 8 of 19 positions shown · non-contrast
Comparison: Previous exams.
COMPARISON: Previous exams.

Addendum:
CLINICAL DATA: RIGHT breast architectural distortion

EXAM:
RIGHT BREAST STEREOTACTIC CORE NEEDLE BIOPSY

[R (1 of 8)]
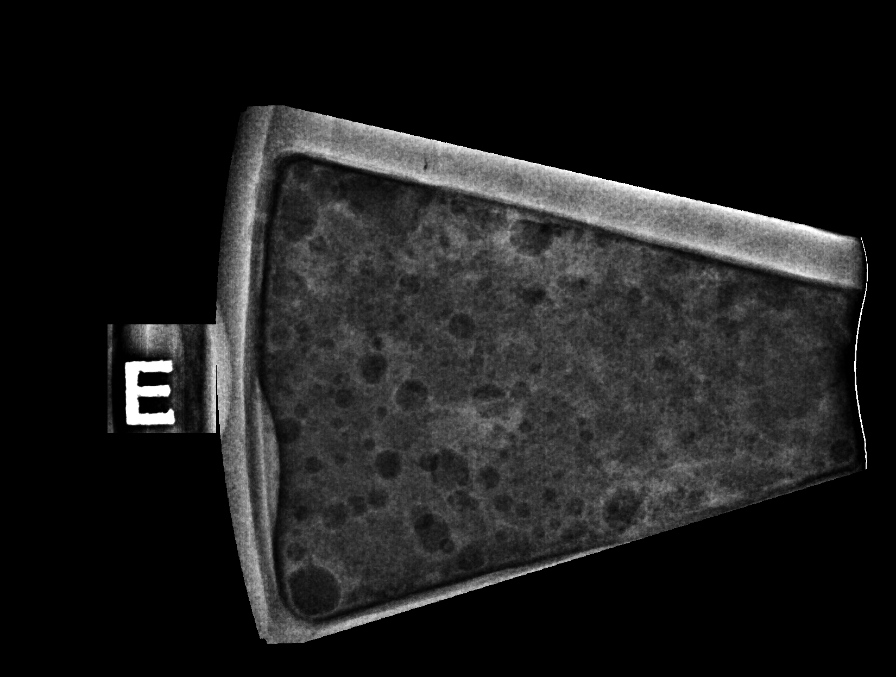

[R (2 of 8)]
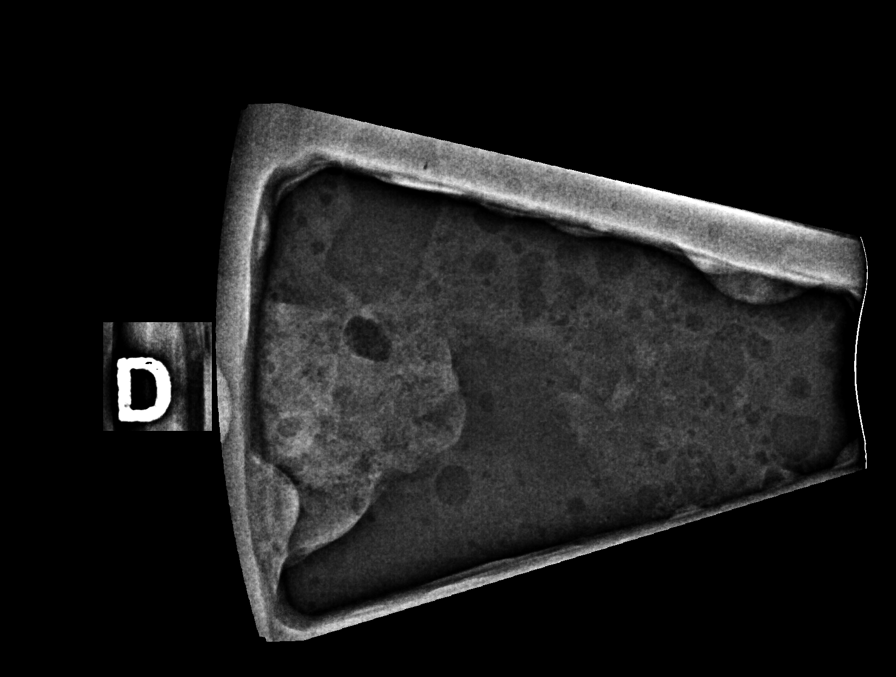

[R (3 of 8)]
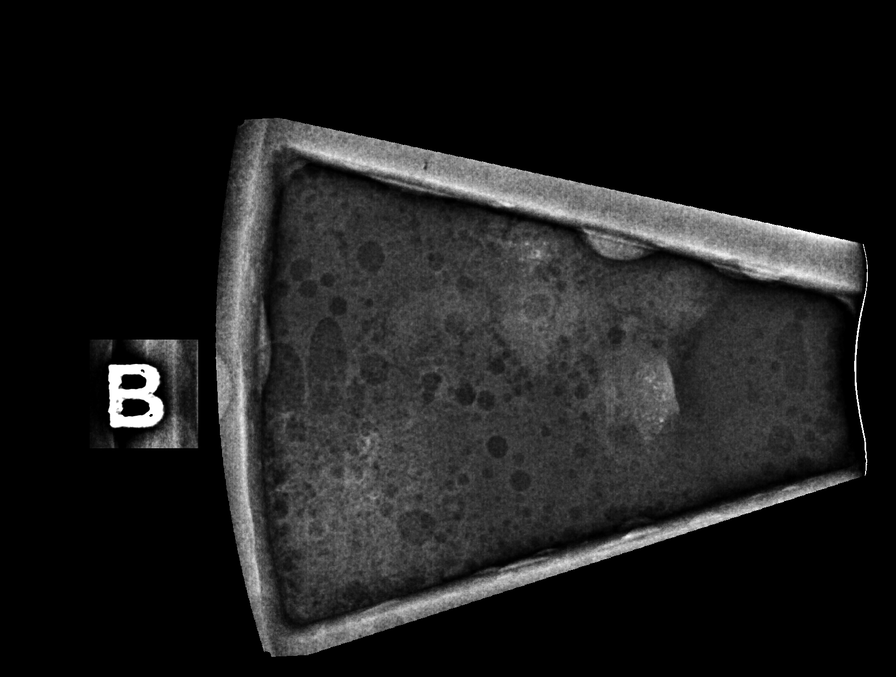

[R (4 of 8)]
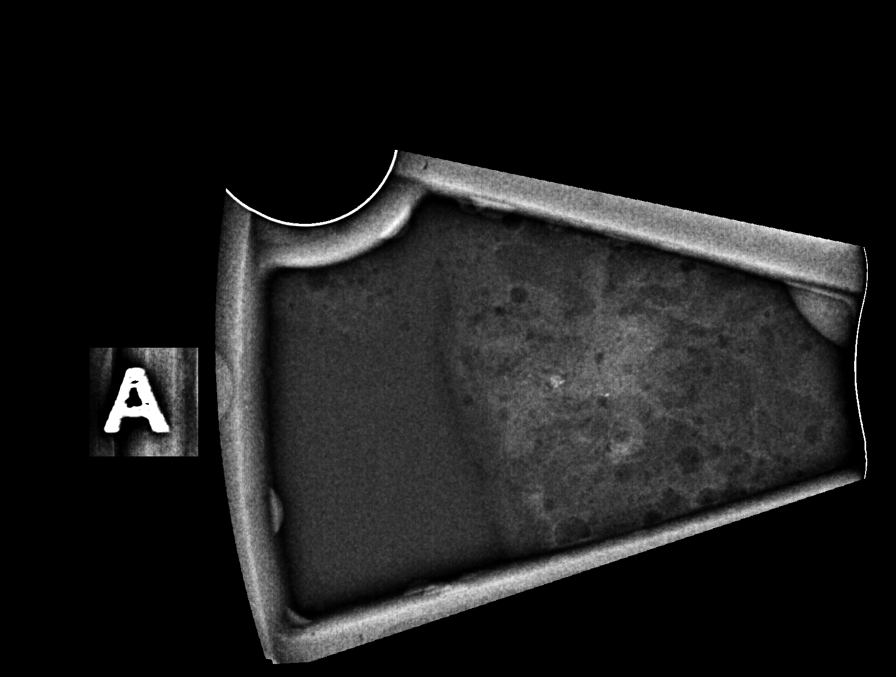

[R (5 of 8)]
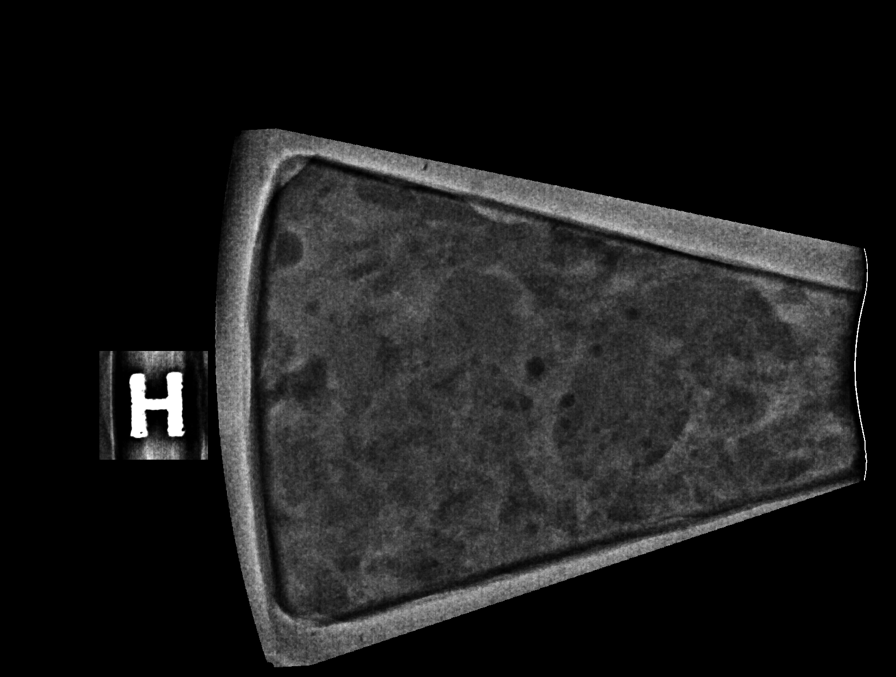

[R (6 of 8)]
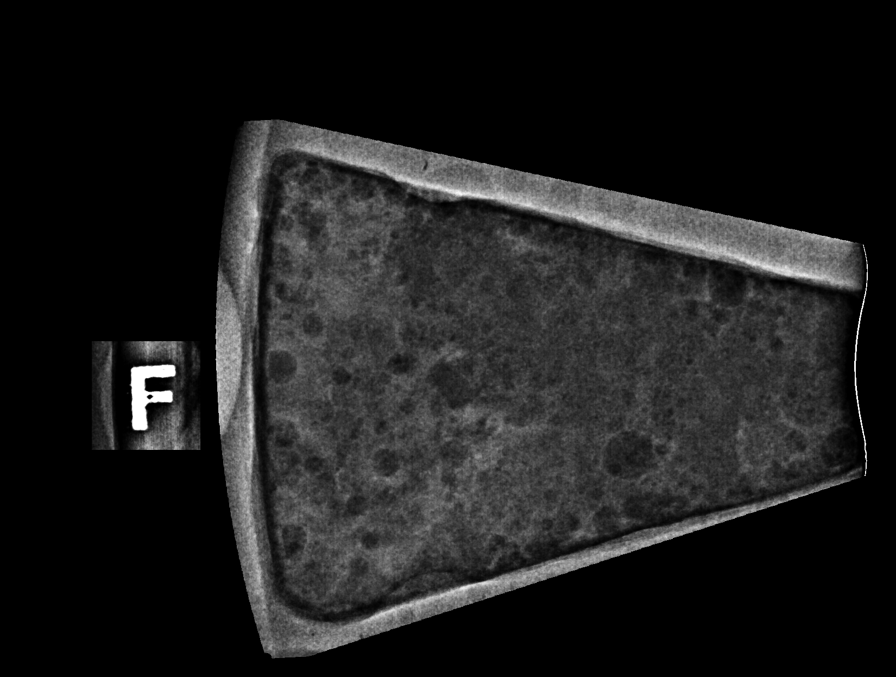

[R (7 of 8)]
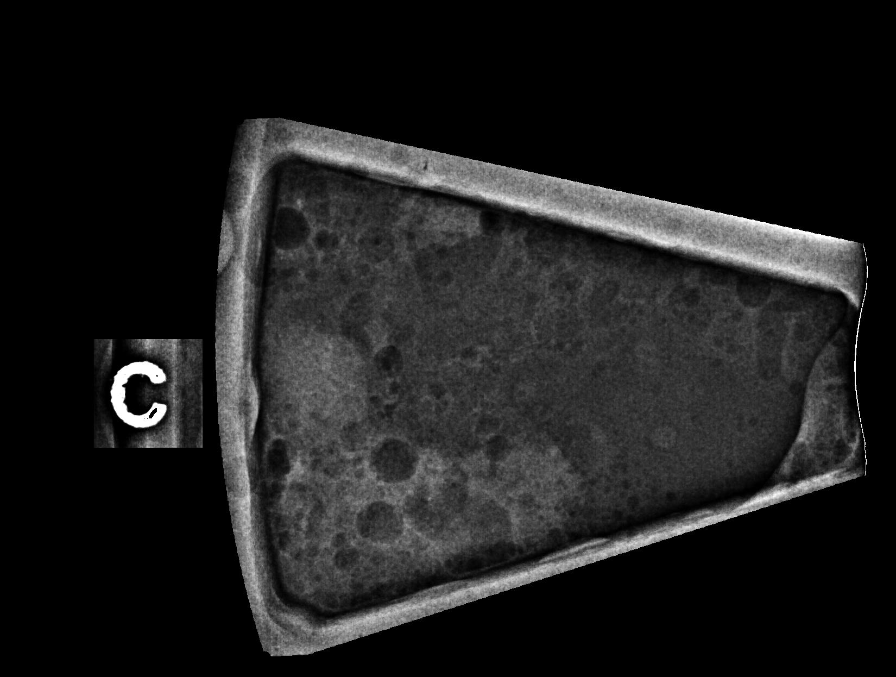

[R (8 of 8)]
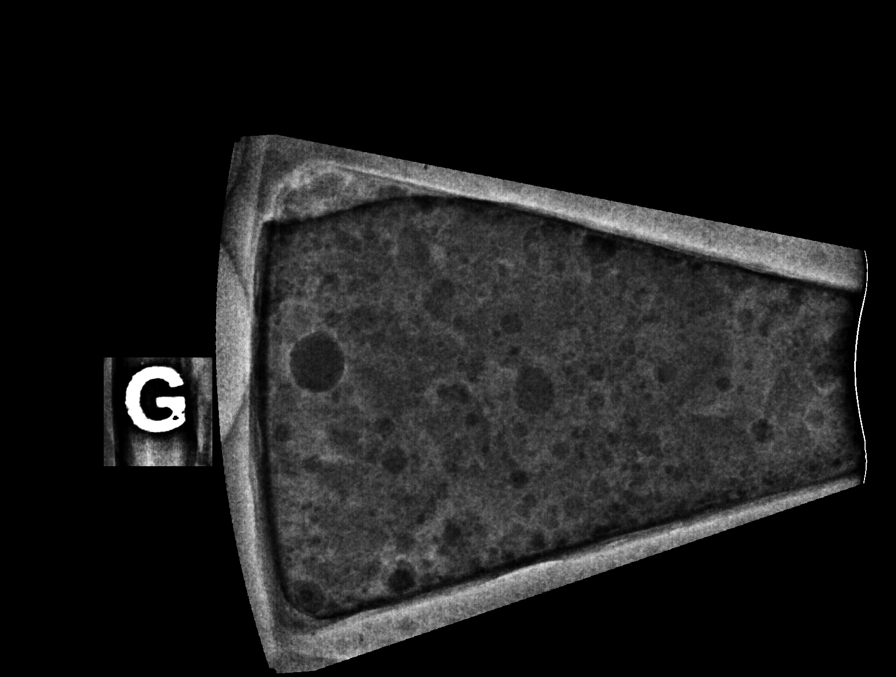

[8 of 19 positions shown; findings below may reference images not displayed]



Using sterile technique and 1% lidocaine and 1% lidocaine with
epinephrine as local anesthetic, under stereotactic guidance, a 9
gauge vacuum assisted device was used to perform core needle biopsy
of architectural distortion in the lower inner RIGHT breast using a
superior approach. Specimen radiograph was performed showing 8 core
samples.

Lesion quadrant: Lower inner quadrant

At the conclusion of the procedure, a RIBBON tissue marker clip was
deployed into the biopsy cavity. Follow-up 2-view mammogram was
performed and dictated separately.
IMPRESSION: Stereotactic-guided biopsy of RIGHT breast architectural distortion.
No apparent complications.

ADDENDUM:
PATHOLOGY revealed: A. RIGHT BREAST, LOWER INNER QUADRANT;
STEREOTACTIC BIOPSY: - BENIGN BREAST TISSUE WITH: - SCLEROSING
ADENOSIS - FIBROCYSTIC AND COLUMNAR CELL CHANGE - APOCRINE
METAPLASIA - USUAL DUCTAL HYPERPLASIA - CALCIFICATIONS IN
ASSOCIATION WITH BENIGN BREAST TISSUE - NEGATIVE FOR ATYPIA AND
MALIGNANCY.

Pathology results are CONCORDANT with imaging findings, per Dr.
Shannique Iyengar.

Pathology results and recommendations below were discussed with
patient by telephone on 07/03/2020. Patient reported biopsy site
within normal limits with slight tenderness at the site. Post biopsy
care instructions were reviewed, questions were answered and my
direct phone number was provided to patient. Patient was instructed
to call [HOSPITAL] if any concerns or questions arise
related to the biopsy.

Recommendation: Patient instructed to return in six months for
unilateral RIGHT breast diagnostic mammogram with dedicated spot
compression tomosynthesis views to assess stability. Patient
informed a reminder notice will be sent regarding this appointment.

Pathology results reported by Rowena Popa RN on 07/03/2020.



Using sterile technique and 1% lidocaine and 1% lidocaine with
epinephrine as local anesthetic, under stereotactic guidance, a 9
gauge vacuum assisted device was used to perform core needle biopsy
of architectural distortion in the lower inner RIGHT breast using a
superior approach. Specimen radiograph was performed showing 8 core
samples.

Lesion quadrant: Lower inner quadrant

At the conclusion of the procedure, a RIBBON tissue marker clip was
deployed into the biopsy cavity. Follow-up 2-view mammogram was
performed and dictated separately.
IMPRESSION: Stereotactic-guided biopsy of RIGHT breast architectural distortion.
No apparent complications.

## 2021-06-30 ENCOUNTER — Other Ambulatory Visit: Payer: Self-pay | Admitting: Family Medicine

## 2022-01-05 ENCOUNTER — Encounter: Payer: Self-pay | Admitting: Family Medicine

## 2022-01-05 MED ORDER — BUPROPION HCL ER (SR) 100 MG PO TB12: 100.0000 mg | ORAL_TABLET | Freq: Every day | ORAL | 2 refills | Status: DC

## 2022-02-01 ENCOUNTER — Ambulatory Visit: Payer: Managed Care, Other (non HMO) | Admitting: Family Medicine

## 2022-02-01 ENCOUNTER — Encounter: Payer: Self-pay | Admitting: Family Medicine

## 2022-02-01 VITALS — BP 110/80 | HR 81 | Temp 98.8°F | Ht 67.0 in | Wt 184.0 lb

## 2022-02-01 DIAGNOSIS — R5383 Other fatigue: Secondary | ICD-10-CM

## 2022-02-01 DIAGNOSIS — E785 Hyperlipidemia, unspecified: Secondary | ICD-10-CM

## 2022-02-01 DIAGNOSIS — E559 Vitamin D deficiency, unspecified: Secondary | ICD-10-CM

## 2022-02-01 DIAGNOSIS — E663 Overweight: Secondary | ICD-10-CM | POA: Diagnosis not present

## 2022-02-01 DIAGNOSIS — F325 Major depressive disorder, single episode, in full remission: Secondary | ICD-10-CM

## 2022-02-01 DIAGNOSIS — K625 Hemorrhage of anus and rectum: Secondary | ICD-10-CM

## 2022-02-01 HISTORY — DX: Other fatigue: R53.83

## 2022-02-01 LAB — COMPREHENSIVE METABOLIC PANEL
ALT: 20 U/L (ref 0–35)
AST: 17 U/L (ref 0–37)
Albumin: 4.4 g/dL (ref 3.5–5.2)
Alkaline Phosphatase: 68 U/L (ref 39–117)
BUN: 14 mg/dL (ref 6–23)
CO2: 30 mEq/L (ref 19–32)
Calcium: 9.4 mg/dL (ref 8.4–10.5)
Chloride: 102 mEq/L (ref 96–112)
Creatinine, Ser: 0.99 mg/dL (ref 0.40–1.20)
GFR: 62.64 mL/min (ref >60.00)
Glucose, Bld: 88 mg/dL (ref 70–99)
Potassium: 4 mEq/L (ref 3.5–5.1)
Sodium: 140 mEq/L (ref 135–145)
Total Bilirubin: 0.5 mg/dL (ref 0.2–1.2)
Total Protein: 7 g/dL (ref 6.0–8.3)

## 2022-02-01 LAB — TSH: TSH: 1.7 u[IU]/mL (ref 0.35–5.50)

## 2022-02-01 LAB — VITAMIN B12: Vitamin B-12: 459 pg/mL (ref 211–911)

## 2022-02-01 LAB — CBC WITH DIFFERENTIAL/PLATELET
Basophils Absolute: 0.1 10*3/uL (ref 0.0–0.1)
Basophils Relative: 1 % (ref 0.0–3.0)
Eosinophils Absolute: 0 10*3/uL (ref 0.0–0.7)
Eosinophils Relative: 1 % (ref 0.0–5.0)
HCT: 40.2 % (ref 36.0–46.0)
Hemoglobin: 13.4 g/dL (ref 12.0–15.0)
Lymphocytes Relative: 28.6 % (ref 12.0–46.0)
Lymphs Abs: 1.5 10*3/uL (ref 0.7–4.0)
MCHC: 33.3 g/dL (ref 30.0–36.0)
MCV: 90 fl (ref 78.0–100.0)
Monocytes Absolute: 0.4 10*3/uL (ref 0.1–1.0)
Monocytes Relative: 8.4 % (ref 3.0–12.0)
Neutro Abs: 3.2 10*3/uL (ref 1.4–7.7)
Neutrophils Relative %: 61 % (ref 43.0–77.0)
Platelets: 264 10*3/uL (ref 150.0–400.0)
RBC: 4.46 Mil/uL (ref 3.87–5.11)
RDW: 13 % (ref 11.5–15.5)
WBC: 5.2 10*3/uL (ref 4.0–10.5)

## 2022-02-01 LAB — LIPID PANEL
Cholesterol: 215 mg/dL — ABNORMAL HIGH (ref 0–200)
HDL: 52.7 mg/dL (ref >39.00)
LDL Cholesterol: 145 mg/dL — ABNORMAL HIGH (ref 0–99)
NonHDL: 162.78
Total CHOL/HDL Ratio: 4
Triglycerides: 91 mg/dL (ref 0.0–149.0)
VLDL: 18.2 mg/dL (ref 0.0–40.0)

## 2022-02-01 LAB — HEMOGLOBIN A1C: Hgb A1c MFr Bld: 5.9 % (ref 4.6–6.5)

## 2022-02-01 LAB — VITAMIN D 25 HYDROXY (VIT D DEFICIENCY, FRACTURES): VITD: 32.73 ng/mL (ref 30.00–100.00)

## 2022-02-01 NOTE — Assessment & Plan Note (Signed)
Check lipid panel.  Encouraged exercise and healthy diet.

## 2022-02-01 NOTE — Assessment & Plan Note (Signed)
Chronic and stable.  She will continue Wellbutrin for milligrams twice daily and Zoloft 100 mg daily.

## 2022-02-01 NOTE — Assessment & Plan Note (Signed)
This has not recurred.  Could have been hemorrhoidal.  She notes her insurance does not pay for colonoscopies though she is on new insurance and has not checked with them.  I encouraged her to check with her insurance to see if they pay for a screening colonoscopy.

## 2022-02-01 NOTE — Assessment & Plan Note (Signed)
Check A1c.  Encouraged healthy diet and exercise. 

## 2022-02-01 NOTE — Assessment & Plan Note (Signed)
Possibly related to poor sleep.  I advised that she not look at any screens the hour before bed.  Discussed eliminating caffeine after 1 PM.  We will get lab work to evaluate for other causes.

## 2022-02-01 NOTE — Progress Notes (Signed)
Tommi Rumps, MD Phone: [phone]  Patty Young is a 61 y.o. female who presents today for f/u.  HLD: no chest pain. Walks her dogs some.  Does not have a lot of time for other exercise.  Diet is generally healthy with plenty of vegetables.  Depression: Patient notes no significant changes.  She is on Wellbutrin and Zoloft.  No anxiety.  No SI.  Fatigue: Patient notes feeling tired and drained for a few months.  She gets up off and on throughout the night at times to go to the bathroom and her dog typically wakes her up.  She typically goes to bed around 8:30 PM.  She gets up around 5 AM to get things done around the house prior to going to work.  She has caffeine after 6 PM.  No alcohol.  She reads before bed though it is on a kindle paper white.  She does snore.  She notes no reported apneic episodes.  She does not wake up well rested.  Social History   Tobacco Use  Smoking Status Never  Smokeless Tobacco Never    Current Outpatient Medications on File Prior to Visit  Medication Sig Dispense Refill   buPROPion ER (WELLBUTRIN SR) 100 MG 12 hr tablet Take 1 tablet (100 mg total) by mouth daily. 90 tablet 2   Cholecalciferol (VITAMIN D3) 2000 units capsule Take 2,000 Units by mouth daily.     Omega-3 Fatty Acids (FISH OIL) 1000 MG CAPS Take 1 capsule by mouth daily.     sertraline (ZOLOFT) 100 MG tablet TAKE 1 TABLET BY MOUTH EVERY DAY 90 tablet 2   valACYclovir (VALTREX) 1000 MG tablet TAKE 2 TABLETS BY MOUTH AT 1ST SIGN OF OUTBREAK, THEN 2 MORE 12 HOURS LATER. (4 PER OUTBREAK) 12 tablet 3   No current facility-administered medications on file prior to visit.     ROS see history of present illness  Objective  Physical Exam Vitals:   02/01/22 0810  BP: 110/80  Pulse: 81  Temp: 98.8 F (37.1 C)  SpO2: 98%    BP Readings from Last 3 Encounters:  02/01/22 110/80  11/03/20 110/80  05/05/20 122/78   Wt Readings from Last 3 Encounters:  02/01/22 184 lb (83.5 kg)   11/03/20 184 lb 9.6 oz (83.7 kg)  05/05/20 190 lb (86.2 kg)    Physical Exam Constitutional:      General: She is not in acute distress.    Appearance: She is not diaphoretic.  Neck:     Thyroid: No thyroid mass, thyromegaly or thyroid tenderness.  Cardiovascular:     Rate and Rhythm: Normal rate and regular rhythm.     Heart sounds: Normal heart sounds.  Pulmonary:     Effort: Pulmonary effort is normal.     Breath sounds: Normal breath sounds.  Skin:    General: Skin is warm and dry.  Neurological:     Mental Status: She is alert.    Epworth Sleepiness Scale score 4  Assessment/Plan: Please see individual problem list.  Problem List Items Addressed This Visit     Depression - Primary (Chronic)    Chronic and stable.  She will continue Wellbutrin for milligrams twice daily and Zoloft 100 mg daily.      Hyperlipidemia (Chronic)    Check lipid panel.  Encouraged exercise and healthy diet.      Relevant Orders   Comp Met (CMET)   Lipid panel   Overweight (Chronic)    Check A1c.  Encouraged healthy diet and exercise.      Relevant Orders   HgB A1c   Vitamin D deficiency disease (Chronic)    Check vitamin D given her fatigue.      Relevant Orders   Vitamin D (25 hydroxy)   Bright red blood per rectum    This has not recurred.  Could have been hemorrhoidal.  She notes her insurance does not pay for colonoscopies though she is on new insurance and has not checked with them.  I encouraged her to check with her insurance to see if they pay for a screening colonoscopy.      Fatigue    Possibly related to poor sleep.  I advised that she not look at any screens the hour before bed.  Discussed eliminating caffeine after 1 PM.  We will get lab work to evaluate for other causes.      Relevant Orders   CBC w/Diff   TSH   B12     Health Maintenance: She will be scheduled for a Pap smear.  Return in about 1 month (around 03/04/2022) for Pap smear.   Tommi Rumps, MD [place]

## 2022-02-01 NOTE — Patient Instructions (Signed)
Nice to see you. Please call your insurance company and see if they cover screening colonoscopies and Pap smears. We will contact you with your lab results.

## 2022-02-01 NOTE — Assessment & Plan Note (Signed)
Check vitamin D given her fatigue.

## 2022-04-01 ENCOUNTER — Other Ambulatory Visit (HOSPITAL_COMMUNITY)
Admission: RE | Admit: 2022-04-01 | Discharge: 2022-04-01 | Disposition: A | Discharge status: Home / Self Care | Payer: Managed Care, Other (non HMO) | Source: Ambulatory Visit | Attending: Family Medicine | Admitting: Family Medicine

## 2022-04-01 ENCOUNTER — Ambulatory Visit (INDEPENDENT_AMBULATORY_CARE_PROVIDER_SITE_OTHER): Payer: Managed Care, Other (non HMO) | Admitting: Family Medicine

## 2022-04-01 ENCOUNTER — Encounter: Payer: Self-pay | Admitting: Family Medicine

## 2022-04-01 VITALS — BP 130/70 | HR 86 | Temp 98.9°F | Ht 67.0 in | Wt 184.8 lb

## 2022-04-01 DIAGNOSIS — Z23 Encounter for immunization: Secondary | ICD-10-CM | POA: Diagnosis not present

## 2022-04-01 DIAGNOSIS — Z124 Encounter for screening for malignant neoplasm of cervix: Secondary | ICD-10-CM | POA: Diagnosis not present

## 2022-04-01 DIAGNOSIS — N882 Stricture and stenosis of cervix uteri: Secondary | ICD-10-CM | POA: Diagnosis not present

## 2022-04-01 NOTE — Patient Instructions (Signed)
GYN should contact you to set up an appointment.

## 2022-04-01 NOTE — Assessment & Plan Note (Signed)
Refer to GYN for evaluation.

## 2022-04-01 NOTE — Progress Notes (Signed)
  Tommi Rumps, MD Phone: [phone]  Patty Young is a 61 y.o. female who presents today for pap smear.  Patient presents for a pap smear. She notes no other issues.  Social History   Tobacco Use  Smoking Status Never  Smokeless Tobacco Never    Current Outpatient Medications on File Prior to Visit  Medication Sig Dispense Refill   buPROPion ER (WELLBUTRIN SR) 100 MG 12 hr tablet Take 1 tablet (100 mg total) by mouth daily. 90 tablet 2   Cholecalciferol (VITAMIN D3) 2000 units capsule Take 2,000 Units by mouth daily.     Omega-3 Fatty Acids (FISH OIL) 1000 MG CAPS Take 1 capsule by mouth daily.     sertraline (ZOLOFT) 100 MG tablet TAKE 1 TABLET BY MOUTH EVERY DAY 90 tablet 2   valACYclovir (VALTREX) 1000 MG tablet TAKE 2 TABLETS BY MOUTH AT 1ST SIGN OF OUTBREAK, THEN 2 MORE 12 HOURS LATER. (4 PER OUTBREAK) 12 tablet 3   No current facility-administered medications on file prior to visit.     ROS see history of present illness  Objective  Physical Exam Vitals:   04/01/22 1532  BP: 130/70  Pulse: 86  Temp: 98.9 F (37.2 C)  SpO2: 99%    BP Readings from Last 3 Encounters:  04/01/22 130/70  02/01/22 110/80  11/03/20 110/80   Wt Readings from Last 3 Encounters:  04/01/22 184 lb 12.8 oz (83.8 kg)  02/01/22 184 lb (83.5 kg)  11/03/20 184 lb 9.6 oz (83.7 kg)    Physical Exam Genitourinary:    Comments: Patty Young, CMA served as chaperone, normal labia, normal vaginal mucosa, stenosis at her cervix, bimanual exam with no tenderness, no adnexal masses     Assessment/Plan: Please see individual problem list.  Problem List Items Addressed This Visit     Cervical cancer screening - Primary    Pap smear completed.       Relevant Orders   Cytology - PAP( )   Cervical stenosis (uterine cervix)    Refer to GYN for evaluation.       Other Visit Diagnoses     Need for immunization against influenza       Relevant Orders   Flu Vaccine  QUAD 50mo+IM (Fluarix, Fluzone & Alfiuria Quad PF) (Completed)        Health Maintenance: I encouraged the patient to check with her insurance on coverage for colonoscopy.   Return in about 6 months (around 10/01/2022).   Tommi Rumps, MD [place]

## 2022-04-01 NOTE — Assessment & Plan Note (Signed)
Pap smear completed

## 2022-04-04 NOTE — Addendum Note (Signed)
Addended by: Leone Haven on: 04/04/2022 08:23 AM   Modules accepted: Level of Service

## 2022-04-06 LAB — CYTOLOGY - PAP
Comment: NEGATIVE
Diagnosis: NEGATIVE
High risk HPV: NEGATIVE

## 2022-04-30 ENCOUNTER — Other Ambulatory Visit: Payer: Self-pay | Admitting: Family Medicine

## 2022-07-29 ENCOUNTER — Other Ambulatory Visit: Payer: Self-pay | Admitting: Family Medicine

## 2022-09-26 ENCOUNTER — Encounter: Payer: Self-pay | Admitting: Podiatry

## 2022-09-26 ENCOUNTER — Ambulatory Visit (INDEPENDENT_AMBULATORY_CARE_PROVIDER_SITE_OTHER): Payer: Managed Care, Other (non HMO) | Admitting: Podiatry

## 2022-09-26 ENCOUNTER — Ambulatory Visit (INDEPENDENT_AMBULATORY_CARE_PROVIDER_SITE_OTHER): Payer: 59

## 2022-09-26 DIAGNOSIS — M21611 Bunion of right foot: Secondary | ICD-10-CM

## 2022-09-26 DIAGNOSIS — M205X1 Other deformities of toe(s) (acquired), right foot: Secondary | ICD-10-CM

## 2022-09-26 DIAGNOSIS — M19071 Primary osteoarthritis, right ankle and foot: Secondary | ICD-10-CM | POA: Diagnosis not present

## 2022-09-26 DIAGNOSIS — M21619 Bunion of unspecified foot: Secondary | ICD-10-CM

## 2022-09-26 NOTE — Patient Instructions (Addendum)
Look for Voltaren gel at the pharmacy over the counter or online (also known as diclofenac 1% gel). Apply to the painful areas 3-4x daily with the supplied dosing card. Allow to dry for 10 minutes before going into socks/shoes   Look for a morton's extension carbon fiber insole to wear under your shoe inserts   Please let me know if the pain gets worse, an injection of steroid will relieve the pain as well.

## 2022-09-27 NOTE — Progress Notes (Signed)
  Subjective:  Patient ID: Patty Young, female    DOB: [birth year],  MRN: [id number]  Chief Complaint  Patient presents with   Edema    -NP- R foot swollen and burns    62 y.o. female presents with the above complaint. History confirmed with patient.  The pain is tolerable with not enough that she takes oral medications for it, she says she is used to the pain and has a high pain tolerance  Objective:  Physical Exam: warm, good capillary refill, no trophic changes or ulcerative lesions, normal DP and PT pulses, normal sensory exam, and painful limited range of motion of right first MTPJ with dorsal palpable spurring   Radiographs: Multiple views x-ray of the right foot: Grade 3 hallux limitus noted on first MTPJ with dorsal spur Assessment:   1. Hallux limitus of right foot   2. Arthritis of first metatarsophalangeal (MTP) joint of right foot      Plan:  Patient was evaluated and treated and all questions answered.  We reviewed her radiographs.  We discussed operative and nonoperative treatment of the hallux limitus and arthritis.  Nonoperatively we discussed using topical anti-inflammatories, corticosteroid injections and a stiff soled shoe and carbon fiber insole.  She will start with this, she will let me know if it is painful enough to require oral medication or corticosteroid injection.  We discussed that ultimately she likely will require arthrodesis of the first MPJ and she will let me know when she is ready to proceed with this.  Return if symptoms worsen or fail to improve.

## 2022-10-03 ENCOUNTER — Telehealth: Payer: Self-pay

## 2022-10-03 ENCOUNTER — Ambulatory Visit (INDEPENDENT_AMBULATORY_CARE_PROVIDER_SITE_OTHER): Payer: Managed Care, Other (non HMO) | Admitting: Family Medicine

## 2022-10-03 ENCOUNTER — Encounter: Payer: Self-pay | Admitting: Family Medicine

## 2022-10-03 VITALS — BP 124/80 | HR 92 | Temp 98.3°F | Ht 67.0 in | Wt 183.8 lb

## 2022-10-03 DIAGNOSIS — B001 Herpesviral vesicular dermatitis: Secondary | ICD-10-CM

## 2022-10-03 DIAGNOSIS — F325 Major depressive disorder, single episode, in full remission: Secondary | ICD-10-CM

## 2022-10-03 DIAGNOSIS — Z1211 Encounter for screening for malignant neoplasm of colon: Secondary | ICD-10-CM | POA: Diagnosis not present

## 2022-10-03 DIAGNOSIS — G43109 Migraine with aura, not intractable, without status migrainosus: Secondary | ICD-10-CM

## 2022-10-03 DIAGNOSIS — Z1231 Encounter for screening mammogram for malignant neoplasm of breast: Secondary | ICD-10-CM | POA: Diagnosis not present

## 2022-10-03 DIAGNOSIS — E063 Autoimmune thyroiditis: Secondary | ICD-10-CM

## 2022-10-03 NOTE — Telephone Encounter (Signed)
We can do Clenpiq or Plenvu or GoLytely or Nulytely  RV

## 2022-10-03 NOTE — Assessment & Plan Note (Signed)
Chronic issue.  Infrequent at this time.  She will monitor.

## 2022-10-03 NOTE — Telephone Encounter (Signed)
Patient has been advised that we can do ClenPiq, Plenvu, Golytely or Nulytely prep for her colonoscopy.  She said she will call me back to schedule after she looks at her work schedule.  Thanks,  [place], [place]

## 2022-10-03 NOTE — Telephone Encounter (Signed)
Patient stated that she is allergic to sulfa and was advised that Suprep contains Sulfa.  The pharmacist told her 2020 that she could not use it because the high amount of Sulfate.  Please advise which prep should be used for her colonoscopy prep.  Thanks, [place], [place]

## 2022-10-03 NOTE — Patient Instructions (Signed)
Nice to see you. Please call [phone] to schedule your mammogram. GI should contact you to set up your colonoscopy.

## 2022-10-03 NOTE — Progress Notes (Signed)
[name], MD Phone: [phone]  Patty Young is a 62 y.o. female who presents today for f/u.  Depression: on wellbutrin and zoloft.  Denies depression and anxiety.  No SI.  Migraines: Patient rarely has a migraine now.  She had 1 this past weekend.  She has not required medication for these in some time.  Cold sores: She gets these typically only during the summer.  She does not require the Valtrex often.  Hashimoto's: Most recent TSH was acceptable.  She notes no skin changes.  Social History   Tobacco Use  Smoking Status Never  Smokeless Tobacco Never    Current Outpatient Medications on File Prior to Visit  Medication Sig Dispense Refill   buPROPion ER (WELLBUTRIN SR) 100 MG 12 hr tablet TAKE 1 TABLET BY MOUTH EVERY DAY 90 tablet 2   Cholecalciferol (VITAMIN D3) 2000 units capsule Take 2,000 Units by mouth daily.     Omega-3 Fatty Acids (FISH OIL) 1000 MG CAPS Take 1 capsule by mouth daily.     sertraline (ZOLOFT) 100 MG tablet TAKE 1 TABLET BY MOUTH EVERY DAY 90 tablet 2   valACYclovir (VALTREX) 1000 MG tablet TAKE 2 TABLETS BY MOUTH AT 1ST SIGN OF OUTBREAK, THEN 2 MORE 12 HOURS LATER. (4 PER OUTBREAK) 12 tablet 3   No current facility-administered medications on file prior to visit.     ROS see history of present illness  Objective  Physical Exam Vitals:   10/03/22 0833  BP: 124/80  Pulse: 92  Temp: 98.3 F (36.8 C)  SpO2: 97%    BP Readings from Last 3 Encounters:  10/03/22 124/80  04/01/22 130/70  02/01/22 110/80   Wt Readings from Last 3 Encounters:  10/03/22 183 lb 12.8 oz (83.4 kg)  04/01/22 184 lb 12.8 oz (83.8 kg)  02/01/22 184 lb (83.5 kg)    Physical Exam Constitutional:      General: She is not in acute distress.    Appearance: She is not diaphoretic.  Cardiovascular:     Rate and Rhythm: Normal rate and regular rhythm.     Heart sounds: Normal heart sounds.  Pulmonary:     Effort: Pulmonary effort is normal.     Breath  sounds: Normal breath sounds.  Musculoskeletal:     Right lower leg: No edema.     Left lower leg: No edema.  Skin:    General: Skin is warm and dry.  Neurological:     Mental Status: She is alert.      Assessment/Plan: Please see individual problem list.  Hashimoto's disease Assessment & Plan: Chronic issue.  TSH acceptable.  Plan for follow-up TSH at next visit.   Major depressive disorder with single episode, in full remission Assessment & Plan: Chronic and stable.  She will continue Wellbutrin 100 mg daily and Zoloft 100 mg daily.   Encounter for screening mammogram for malignant neoplasm of breast -     3D Screening Mammogram, Left and Right; Future  Colon cancer screening -     Ambulatory referral to Gastroenterology  Migraine with aura and without status migrainosus, not intractable Assessment & Plan: Chronic issue.  Infrequent at this time.  She will monitor.   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  Patient can continue Valtrex 2000 mg for 2 doses at the first sign of outbreak.      Health Maintenance: Patient will call to schedule her mammogram.  Colonoscopy referral placed.  She will let us know  if she does not hear from them in 2 weeks.  Return in about 6 months (around 04/04/2023) for physical.   [name], MD [facility]

## 2022-10-03 NOTE — Assessment & Plan Note (Signed)
Chronic and stable.  She will continue Wellbutrin 100 mg daily and Zoloft 100 mg daily.

## 2022-10-03 NOTE — Assessment & Plan Note (Signed)
Chronic issue.  TSH acceptable.  Plan for follow-up TSH at next visit.

## 2022-10-03 NOTE — Assessment & Plan Note (Signed)
Chronic intermittent issue.  Patient can continue Valtrex 2000 mg for 2 doses at the first sign of outbreak.

## 2022-10-05 ENCOUNTER — Other Ambulatory Visit: Payer: Self-pay

## 2022-10-05 ENCOUNTER — Telehealth: Payer: Self-pay

## 2022-10-05 DIAGNOSIS — Z1211 Encounter for screening for malignant neoplasm of colon: Secondary | ICD-10-CM

## 2022-10-05 MED ORDER — CLENPIQ 10-3.5-12 MG-GM -GM/160ML PO SOLN: 1.0000 | Freq: Once | ORAL | 0 refills | Status: DC

## 2022-10-05 NOTE — Telephone Encounter (Signed)
Gastroenterology Pre-Procedure Review  Request Date: 11/07/21 Requesting Physician: Dr. Allegra Lai  PATIENT REVIEW QUESTIONS: The patient responded to the following health history questions as indicated:    1. Are you having any GI issues? no 2. Do you have a personal history of Polyps? no 3. Do you have a family history of Colon Cancer or Polyps? no 4. Diabetes Mellitus? no 5. Joint replacements in the past 12 months?no 6. Major health problems in the past 3 months?no 7. Any artificial heart valves, MVP, or defibrillator?no    MEDICATIONS & ALLERGIES:    Patient reports the following regarding taking any anticoagulation/antiplatelet therapy:   Plavix, Coumadin, Eliquis, Xarelto, Lovenox, Pradaxa, Brilinta, or Effient? no Aspirin? no  Patient confirms/reports the following medications:  Current Outpatient Medications  Medication Sig Dispense Refill   Sod Picosulfate-Mag Ox-Cit Acd (CLENPIQ) 10-3.5-12 MG-GM -GM/160ML SOLN Take 1 Dose by mouth once for 1 dose. 320 mL 0   buPROPion ER (WELLBUTRIN SR) 100 MG 12 hr tablet TAKE 1 TABLET BY MOUTH EVERY DAY 90 tablet 2   Cholecalciferol (VITAMIN D3) 2000 units capsule Take 2,000 Units by mouth daily.     Omega-3 Fatty Acids (FISH OIL) 1000 MG CAPS Take 1 capsule by mouth daily.     sertraline (ZOLOFT) 100 MG tablet TAKE 1 TABLET BY MOUTH EVERY DAY 90 tablet 2   valACYclovir (VALTREX) 1000 MG tablet TAKE 2 TABLETS BY MOUTH AT 1ST SIGN OF OUTBREAK, THEN 2 MORE 12 HOURS LATER. (4 PER OUTBREAK) 12 tablet 3   No current facility-administered medications for this visit.    Patient confirms/reports the following allergies:  Allergies  Allergen Reactions   Sulfa Antibiotics Swelling    Pt stated she had swelling, and rash, dizziness    No orders of the defined types were placed in this encounter.   AUTHORIZATION INFORMATION Primary Insurance: 1D#: Group #:  Secondary Insurance: 1D#: Group #:  SCHEDULE INFORMATION: Date:  11/08/22 Time: Location: [facility]

## 2022-11-01 ENCOUNTER — Encounter: Payer: Self-pay | Admitting: Gastroenterology

## 2022-11-02 NOTE — Anesthesia Preprocedure Evaluation (Addendum)
Anesthesia Evaluation  Patient identified by MRN, date of birth, ID band Patient awake    Reviewed: Allergy & Precautions, H&P , NPO status , Patient's Chart, lab work & pertinent test results  History of Anesthesia Complications (+) PONV and history of anesthetic complications  Airway Mallampati: I  TM Distance: <3 FB Neck ROM: Full    Dental  (+) Edentulous Lower, Edentulous Upper   Pulmonary neg pulmonary ROS   Pulmonary exam normal breath sounds clear to auscultation       Cardiovascular negative cardio ROS Normal cardiovascular exam Rhythm:Regular Rate:Normal     Neuro/Psych  Headaches PSYCHIATRIC DISORDERS  Depression    Has mild headache today    GI/Hepatic negative GI ROS, Neg liver ROS,,,  Endo/Other  Hashimoto dz  Renal/GU Renal diseaseCKD stage III  negative genitourinary   Musculoskeletal negative musculoskeletal ROS (+)    Abdominal   Peds negative pediatric ROS (+)  Hematology negative hematology ROS (+)   Anesthesia Other Findings Depression  Hyperlipidemia Migraines  CKD (chronic kidney disease) stage III per EPic Obesity  Hashimoto's disease Vitamin D deficiency disease  PONV (postoperative nausea and vomiting) Wears dentures     Reproductive/Obstetrics negative OB ROS                             Anesthesia Physical Anesthesia Plan  ASA: 2  Anesthesia Plan: General   Post-op Pain Management:    Induction: Intravenous  PONV Risk Score and Plan:   Airway Management Planned: Natural Airway and Nasal Cannula  Additional Equipment:   Intra-op Plan:   Post-operative Plan:   Informed Consent: I have reviewed the patients History and Physical, chart, labs and discussed the procedure including the risks, benefits and alternatives for the proposed anesthesia with the patient or authorized representative who has indicated his/her understanding and  acceptance.     Dental Advisory Given  Plan Discussed with: Anesthesiologist, CRNA and Surgeon  Anesthesia Plan Comments: (Patient consented for risks of anesthesia including but not limited to:  - adverse reactions to medications - risk of airway placement if required - damage to eyes, teeth, lips or other oral mucosa - nerve damage due to positioning  - sore throat or hoarseness - Damage to heart, brain, nerves, lungs, other parts of body or loss of life  Patient voiced understanding.)       Anesthesia Quick Evaluation

## 2022-11-08 ENCOUNTER — Encounter
Admission: RE | Disposition: A | Discharge status: Home / Self Care | Payer: Self-pay | Source: Home / Self Care | Attending: Gastroenterology

## 2022-11-08 ENCOUNTER — Ambulatory Visit: Payer: Managed Care, Other (non HMO) | Admitting: Anesthesiology

## 2022-11-08 ENCOUNTER — Encounter: Payer: Self-pay | Admitting: Gastroenterology

## 2022-11-08 ENCOUNTER — Other Ambulatory Visit: Payer: Self-pay

## 2022-11-08 ENCOUNTER — Ambulatory Visit
Admission: RE | Admit: 2022-11-08 | Discharge: 2022-11-08 | Disposition: A | Discharge status: Home / Self Care | Payer: Managed Care, Other (non HMO) | Attending: Gastroenterology | Admitting: Gastroenterology

## 2022-11-08 DIAGNOSIS — Z1211 Encounter for screening for malignant neoplasm of colon: Secondary | ICD-10-CM | POA: Diagnosis not present

## 2022-11-08 DIAGNOSIS — N182 Chronic kidney disease, stage 2 (mild): Secondary | ICD-10-CM | POA: Insufficient documentation

## 2022-11-08 HISTORY — DX: Presence of dental prosthetic device (complete) (partial): Z97.2

## 2022-11-08 HISTORY — DX: Nausea with vomiting, unspecified: R11.2

## 2022-11-08 HISTORY — PX: COLONOSCOPY WITH PROPOFOL: SHX5780

## 2022-11-08 HISTORY — DX: Other specified postprocedural states: Z98.890

## 2022-11-08 SURGERY — COLONOSCOPY WITH PROPOFOL
Anesthesia: General

## 2022-11-08 MED ORDER — STERILE WATER FOR IRRIGATION IR SOLN: Status: DC | PRN

## 2022-11-08 MED ORDER — PROPOFOL 500 MG/50ML IV EMUL
INTRAVENOUS | Status: DC | PRN
  Administered 2022-11-08: 100 ug/kg/min via INTRAVENOUS

## 2022-11-08 MED ORDER — PROPOFOL 10 MG/ML IV BOLUS
INTRAVENOUS | Status: DC | PRN
  Administered 2022-11-08: 80 mg via INTRAVENOUS

## 2022-11-08 MED ORDER — SODIUM CHLORIDE 0.9 % IV SOLN: INTRAVENOUS | Status: DC

## 2022-11-08 MED ORDER — LIDOCAINE HCL (CARDIAC) PF 100 MG/5ML IV SOSY
PREFILLED_SYRINGE | INTRAVENOUS | Status: DC | PRN
  Administered 2022-11-08: 50 mg via INTRAVENOUS

## 2022-11-08 MED ORDER — STERILE WATER FOR IRRIGATION IR SOLN
Status: DC | PRN
  Administered 2022-11-08: 1000 mL

## 2022-11-08 MED ORDER — LACTATED RINGERS IV SOLN: INTRAVENOUS | Status: DC

## 2022-11-08 SURGICAL SUPPLY — 7 items
GAUZE SPONGE 4X4 12PLY STRL (GAUZE/BANDAGES/DRESSINGS) IMPLANT
GOWN CVR UNV OPN BCK APRN NK (MISCELLANEOUS) ×2 IMPLANT
GOWN ISOL THUMB LOOP REG UNIV (MISCELLANEOUS) ×2
KIT PRC NS LF DISP ENDO (KITS) ×1 IMPLANT
KIT PROCEDURE OLYMPUS (KITS) ×1
MANIFOLD NEPTUNE II (INSTRUMENTS) ×1 IMPLANT
WATER STERILE IRR 250ML POUR (IV SOLUTION) ×1 IMPLANT

## 2022-11-08 NOTE — Anesthesia Postprocedure Evaluation (Signed)
Anesthesia Post Note  Patient: Patty Young  Procedure(s) Performed: COLONOSCOPY WITH PROPOFOL  Patient location during evaluation: PACU Anesthesia Type: General Level of consciousness: awake and alert Pain management: pain level controlled Vital Signs Assessment: post-procedure vital signs reviewed and stable Respiratory status: spontaneous breathing, nonlabored ventilation, respiratory function stable and patient connected to nasal cannula oxygen Cardiovascular status: blood pressure returned to baseline and stable Postop Assessment: no apparent nausea or vomiting Anesthetic complications: no   No notable events documented.   Last Vitals:  Vitals:   11/08/22 0748 11/08/22 0755  BP: 99/79 106/63  Pulse: 78 80  Resp: 14 (!) 21  Temp: (!) 36.2 C (!) 36.2 C  SpO2: 99% 93%    Last Pain:  Vitals:   11/08/22 0755  TempSrc:   PainSc: 0-No pain                 Stefen Juba C Tajuan Dufault

## 2022-11-08 NOTE — H&P (Signed)
[name], MD [address]  [address]  [place], [place] [address]  Main: [phone]  Fax: [phone] Pager: [phone]  Primary Care Physician:  Glori Luis, MD Primary Gastroenterologist:  Dr. [name]  Pre-Procedure History & Physical: HPI:  Patty Young is a 62 y.o. female is here for an colonoscopy.   Past Medical History:  Diagnosis Date   CKD (chronic kidney disease) stage 2, GFR 60-89 ml/min    Depression    Hashimoto's disease    Hyperlipidemia    Migraines    approx 1x/month   Obesity    PONV (postoperative nausea and vomiting)    Vitamin D deficiency disease 08/26/2014   11.7   Wears dentures    full upper and lower    Past Surgical History:  Procedure Laterality Date   ABDOMINAL HYSTERECTOMY  09/2005   still has ovaries and cervix, needs paps   BREAST BIOPSY Right 07/02/2020   rt breast stereo distortion ribbon clip path pending    Prior to Admission medications   Medication Sig Start Date End Date Taking? Authorizing Provider  buPROPion ER [facility] SR) 100 MG 12 hr tablet TAKE 1 TABLET BY MOUTH EVERY DAY 07/29/22  Yes Glori Luis, MD  Cholecalciferol (VITAMIN D3) 2000 units capsule Take 2,000 Units by mouth daily.   Yes [provider]  Multiple Vitamins-Minerals (HAIR SKIN & NAILS PO) Take by mouth daily.   Yes [provider]  Multiple Vitamins-Minerals (MULTIVITAMIN WOMEN 50+ PO) Take by mouth daily.   Yes [provider]  Omega-3 Fatty Acids (FISH OIL) 1000 MG CAPS Take 1 capsule by mouth daily.   Yes [provider]  Probiotic Product (PROBIOTIC DAILY PO) Take by mouth daily.   Yes [provider]  sertraline (ZOLOFT) 100 MG tablet TAKE 1 TABLET BY MOUTH EVERY DAY 05/02/22  Yes Glori Luis, MD  valACYclovir (VALTREX) 1000 MG tablet TAKE 2 TABLETS BY MOUTH AT 1ST SIGN OF OUTBREAK, THEN 2 MORE 12 HOURS LATER. (4 PER OUTBREAK) 02/13/20  Yes Glori Luis, MD     Allergies as of 10/05/2022 - Review Complete 10/03/2022  Allergen Reaction Noted   Sulfa antibiotics Swelling 01/08/2015    Family History  Problem Relation Age of Onset   COPD Mother    Hypertension Mother    Osteoporosis Mother    Hypertension Father    Hyperlipidemia Brother     Social History   Socioeconomic History   Marital status: Married    Spouse name: Not on file   Number of children: Not on file   Years of education: Not on file   Highest education level: Not on file  Occupational History   Not on file  Tobacco Use   Smoking status: Never   Smokeless tobacco: Never  Vaping Use   Vaping Use: Never used  Substance and Sexual Activity   Alcohol use: No   Drug use: No   Sexual activity: Not on file  Other Topics Concern   Not on file  Social History Narrative   Not on file   Social Determinants of Health   Financial Resource Strain: Not on file  Food Insecurity: Not on file  Transportation Needs: Not on file  Physical Activity: Not on file  Stress: Not on file  Social Connections: Not on file  Intimate Partner Violence: Not on file    Review of Systems: See HPI, otherwise negative ROS  Physical Exam: BP 113/69  Pulse 82   Temp 98.1 F (36.7 C) (Temporal)   Resp 11   Ht 5' 7.01" (1.702 m)   Wt 82.1 kg   SpO2 98%   BMI 28.34 kg/m  General:   Alert,  pleasant and cooperative in NAD Head:  Normocephalic and atraumatic. Neck:  Supple; no masses or thyromegaly. Lungs:  Clear throughout to auscultation.    Heart:  Regular rate and rhythm. Abdomen:  Soft, nontender and nondistended. Normal bowel sounds, without guarding, and without rebound.   Neurologic:  Alert and  oriented x4;  grossly normal neurologically.  Impression/Plan: Patty Young is here for an colonoscopy to be performed for colon cancer screening  Risks, benefits, limitations, and alternatives regarding  colonoscopy have been reviewed with the patient.  Questions have  been answered.  All parties agreeable.   Lannette Donath, MD  11/08/2022, 7:21 AM

## 2022-11-08 NOTE — Op Note (Signed)
[facility] Patient Name: Patty Young Procedure Date: 11/08/2022 7:18 AM MRN: [id number] Account #: [id number] Date of Birth: 06/26/1961 Admit Type: Outpatient Age: 62 Room: [facility] OR ROOM 01 Gender: Female Note Status: Finalized Instrument Name: [id number] Procedure:             Colonoscopy Indications:           Screening for colorectal malignant neoplasm, This is                         the patient's first colonoscopy Providers:             Toney Reil MD, MD Referring MD:          Toney Reil MD, MD (Referring MD), Yehuda Mao.                         Birdie Sons (Referring MD) Medicines:             General Anesthesia Complications:         No immediate complications. Estimated blood loss: None. Procedure:             Pre-Anesthesia Assessment:                        - Prior to the procedure, a History and Physical was                         performed, and patient medications and allergies were                         reviewed. The patient is competent. The risks and                         benefits of the procedure and the sedation options and                         risks were discussed with the patient. All questions                         were answered and informed consent was obtained.                         Patient identification and proposed procedure were                         verified by the physician, the nurse, the                         anesthesiologist, the anesthetist and the technician                         in the pre-procedure area in the procedure room in the                         endoscopy suite. Mental Status Examination: alert and                         oriented. Airway Examination: normal oropharyngeal  airway and neck mobility. Respiratory Examination:                         clear to auscultation. CV Examination: normal.                         Prophylactic Antibiotics: The patient  does not require                         prophylactic antibiotics. Prior Anticoagulants: The                         patient has taken no anticoagulant or antiplatelet                         agents. ASA Grade Assessment: II - A patient with mild                         systemic disease. After reviewing the risks and                         benefits, the patient was deemed in satisfactory                         condition to undergo the procedure. The anesthesia                         plan was to use general anesthesia. Immediately prior                         to administration of medications, the patient was                         re-assessed for adequacy to receive sedatives. The                         heart rate, respiratory rate, oxygen saturations,                         blood pressure, adequacy of pulmonary ventilation, and                         response to care were monitored throughout the                         procedure. The physical status of the patient was                         re-assessed after the procedure.                        After obtaining informed consent, the colonoscope was                         passed under direct vision. Throughout the procedure,                         the patient's blood pressure, pulse, and oxygen  saturations were monitored continuously. The                         Colonoscope was introduced through the anus and                         advanced to the the cecum, identified by appendiceal                         orifice and ileocecal valve. The colonoscopy was                         performed without difficulty. The patient tolerated                         the procedure well. The quality of the bowel                         preparation was evaluated using the BBPS [facility] Bowel                         Preparation Scale) with scores of: Right Colon = 3,                         Transverse Colon = 3 and Left  Colon = 3 (entire mucosa                         seen well with no residual staining, small fragments                         of stool or opaque liquid). The total BBPS score                         equals 9. The ileocecal valve, appendiceal orifice,                         and rectum were photographed. Findings:      The perianal and digital rectal examinations were normal. Pertinent       negatives include normal sphincter tone and no palpable rectal lesions.      The entire examined colon appeared normal.      The retroflexed view of the distal rectum and anal verge was normal and       showed no anal or rectal abnormalities. Impression:            - The entire examined colon is normal.                        - The distal rectum and anal verge are normal on                         retroflexion view.                        - No specimens collected. Recommendation:        - Discharge patient to home (with escort).                        -  Resume previous diet today.                        - Continue present medications.                        - Repeat colonoscopy in 10 years for screening                         purposes. Procedure Code(s):     --- Professional ---                        I6962, Colorectal cancer screening; colonoscopy on                         individual not meeting criteria for high risk Diagnosis Code(s):     --- Professional ---                        Z12.11, Encounter for screening for malignant neoplasm                         of colon CPT copyright 2022 American Medical Association. All rights reserved. The codes documented in this report are preliminary and upon coder review may  be revised to meet current compliance requirements. Dr. Libby Maw Toney Reil MD, MD 11/08/2022 7:46:12 AM This report has been signed electronically. Number of Addenda: 0 Note Initiated On: 11/08/2022 7:18 AM Scope Withdrawal Time: 0 hours 6 minutes 57 seconds  Total  Procedure Duration: 0 hours 9 minutes 5 seconds  Estimated Blood Loss:  Estimated blood loss: none.      [facility]

## 2022-11-08 NOTE — Transfer of Care (Signed)
Immediate Anesthesia Transfer of Care Note  Patient: Patty Young  Procedure(s) Performed: COLONOSCOPY WITH PROPOFOL  Patient Location: PACU  Anesthesia Type:General  Level of Consciousness: sedated  Airway & Oxygen Therapy: Patient Spontanous Breathing  Post-op Assessment: Report given to RN and Post -op Vital signs reviewed and stable  Post vital signs: Reviewed and stable  Last Vitals:  Vitals Value Taken Time  BP 99/79 11/08/22 0748  Temp 36.2 C 11/08/22 0748  Pulse 79 11/08/22 0749  Resp 15 11/08/22 0749  SpO2 94 % 11/08/22 0749  Vitals shown include unvalidated device data.  Last Pain:  Vitals:   11/08/22 0748  TempSrc:   PainSc: Asleep         Complications: No notable events documented.

## 2022-11-09 ENCOUNTER — Encounter: Payer: Self-pay | Admitting: Gastroenterology

## 2023-01-21 ENCOUNTER — Other Ambulatory Visit: Payer: Self-pay | Admitting: Family Medicine

## 2023-04-04 ENCOUNTER — Encounter: Payer: Managed Care, Other (non HMO) | Admitting: Family Medicine

## 2023-04-14 ENCOUNTER — Encounter: Payer: Self-pay | Admitting: Family Medicine

## 2023-04-14 ENCOUNTER — Ambulatory Visit (INDEPENDENT_AMBULATORY_CARE_PROVIDER_SITE_OTHER): Payer: Managed Care, Other (non HMO) | Admitting: Family Medicine

## 2023-04-14 VITALS — BP 118/74 | HR 81 | Temp 98.2°F | Ht 67.0 in | Wt 186.2 lb

## 2023-04-14 DIAGNOSIS — E063 Autoimmune thyroiditis: Secondary | ICD-10-CM

## 2023-04-14 DIAGNOSIS — Z0000 Encounter for general adult medical examination without abnormal findings: Secondary | ICD-10-CM

## 2023-04-14 DIAGNOSIS — R7303 Prediabetes: Secondary | ICD-10-CM

## 2023-04-14 DIAGNOSIS — E559 Vitamin D deficiency, unspecified: Secondary | ICD-10-CM

## 2023-04-14 DIAGNOSIS — Z23 Encounter for immunization: Secondary | ICD-10-CM | POA: Diagnosis not present

## 2023-04-14 DIAGNOSIS — E785 Hyperlipidemia, unspecified: Secondary | ICD-10-CM

## 2023-04-14 DIAGNOSIS — Z1231 Encounter for screening mammogram for malignant neoplasm of breast: Secondary | ICD-10-CM

## 2023-04-14 LAB — LIPID PANEL
Cholesterol: 236 mg/dL — ABNORMAL HIGH (ref 0–200)
HDL: 51.6 mg/dL (ref >39.00)
LDL Cholesterol: 157 mg/dL — ABNORMAL HIGH (ref 0–99)
NonHDL: 184.42
Total CHOL/HDL Ratio: 5
Triglycerides: 138 mg/dL (ref 0.0–149.0)
VLDL: 27.6 mg/dL (ref 0.0–40.0)

## 2023-04-14 LAB — COMPREHENSIVE METABOLIC PANEL
ALT: 20 U/L (ref 0–35)
AST: 18 U/L (ref 0–37)
Albumin: 4.3 g/dL (ref 3.5–5.2)
Alkaline Phosphatase: 82 U/L (ref 39–117)
BUN: 13 mg/dL (ref 6–23)
CO2: 30 meq/L (ref 19–32)
Calcium: 9.5 mg/dL (ref 8.4–10.5)
Chloride: 101 meq/L (ref 96–112)
Creatinine, Ser: 0.95 mg/dL (ref 0.40–1.20)
GFR: 65.27 mL/min (ref >60.00)
Glucose, Bld: 86 mg/dL (ref 70–99)
Potassium: 3.9 meq/L (ref 3.5–5.1)
Sodium: 138 meq/L (ref 135–145)
Total Bilirubin: 0.5 mg/dL (ref 0.2–1.2)
Total Protein: 7.4 g/dL (ref 6.0–8.3)

## 2023-04-14 LAB — VITAMIN D 25 HYDROXY (VIT D DEFICIENCY, FRACTURES): VITD: 46.06 ng/mL (ref 30.00–100.00)

## 2023-04-14 LAB — HEMOGLOBIN A1C: Hgb A1c MFr Bld: 5.8 % (ref 4.6–6.5)

## 2023-04-14 LAB — TSH: TSH: 2.4 u[IU]/mL (ref 0.35–5.50)

## 2023-04-14 NOTE — Assessment & Plan Note (Signed)
Physical exam completed.  Encouraged healthy diet and exercise.  Discussed trying to add in exercise 2 to 3 days a week.  She will call to schedule her mammogram.  Pap smear and colonoscopy are up-to-date.  She will be given her flu vaccine today.  She declines further COVID vaccinations.  She is going to check with her insurance regarding coverage for the Shingrix vaccine.  Lab work as outlined.

## 2023-04-14 NOTE — Addendum Note (Signed)
Addended by: Prince Solian A on: 04/14/2023 10:47 AM   Modules accepted: Orders

## 2023-04-14 NOTE — Progress Notes (Signed)
[name], MD Phone: [phone]  Patty Young is a 62 y.o. female who presents today for CPE.  Diet: Fruits/vegetables, minimal soda or sweet tea, not many sweets or junk, only has fried foods and they eat out 1-2 times a week Exercise: None given time constraints Pap smear: 04/01/2022 negative HPV NILM Colonoscopy: 11/08/2022 with 10-year recall Mammogram: Due Family history-  Colon cancer: no  Breast cancer: no  Ovarian cancer: no Menses: s/p hysterectomy Vaccines-   Flu: due  Tetanus: UTD  Shingles: due  COVID19: x2 HIV screening: UTD Hep C Screening: UTD Tobacco use: no Alcohol use: no Illicit Drug use: no Dentist: dentures Ophthalmology: yes   Active Ambulatory Problems    Diagnosis Date Noted   Depression    Hyperlipidemia    Migraines    Chronic kidney disease (CKD), stage III (moderate) (HCC)    Hashimoto's disease    Vitamin D deficiency disease    Seborrheic keratoses 04/16/2015   Heel pain 04/14/2017   Hypertrophy of right clavicle 04/14/2017   Clavicular area fullness 05/16/2017   Bright red blood per rectum 10/18/2018   Breast cancer screening 10/18/2018   Restless legs 08/19/2019   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   Acute right-sided thoracic back pain 11/08/2019   Bunion 05/05/2020   Overweight 11/03/2020   History of COVID-19 11/03/2020   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   Numbness and tingling in right hand 07/12/2017   Fatigue 02/01/2022   Cervical cancer screening 04/01/2022   Cervical stenosis (uterine cervix) 04/01/2022   Encounter for screening colonoscopy 11/08/2022   Routine general medical examination at a health care facility 04/14/2023   Resolved Ambulatory Problems    Diagnosis Date Noted   Hot flashes 10/13/2016   Recurrent oral ulcers 07/12/2017   Past Medical History:  Diagnosis Date   CKD (chronic kidney disease) stage 2, GFR 60-89 ml/min    Obesity    PONV (postoperative nausea and vomiting)    Wears  dentures     Family History  Problem Relation Age of Onset   COPD Mother    Hypertension Mother    Osteoporosis Mother    Hypertension Father    Hyperlipidemia Brother     Social History   Socioeconomic History   Marital status: Married    Spouse name: Not on file   Number of children: Not on file   Years of education: Not on file   Highest education level: Not on file  Occupational History   Not on file  Tobacco Use   Smoking status: Never   Smokeless tobacco: Never  Vaping Use   Vaping status: Never Used  Substance and Sexual Activity   Alcohol use: No   Drug use: No   Sexual activity: Not on file  Other Topics Concern   Not on file  Social History Narrative   Not on file   Social Determinants of Health   Financial Resource Strain: Not on file  Food Insecurity: Not on file  Transportation Needs: Not on file  Physical Activity: Not on file  Stress: Not on file  Social Connections: Not on file  Intimate Partner Violence: Not on file    ROS  General:  Negative for nexplained weight loss, fever Skin: Negative for new or changing mole, sore that won't heal HEENT: Negative for trouble hearing, trouble seeing, ringing in ears, mouth sores, hoarseness, change in voice, dysphagia. CV:  Negative for chest pain, dyspnea, edema, palpitations Resp: Negative for cough, dyspnea, hemoptysis  GI: Negative for nausea, vomiting, diarrhea, constipation, abdominal pain, melena, hematochezia. GU: Negative for dysuria, incontinence, urinary hesitance, hematuria, vaginal or penile discharge, polyuria, sexual difficulty, lumps in testicle or breasts MSK: Negative for muscle cramps or aches, joint pain or swelling Neuro: Negative for headaches, weakness, numbness, dizziness, passing out/fainting Psych: Negative for depression, anxiety, memory problems  Objective  Physical Exam Vitals:   04/14/23 0819  BP: 118/74  Pulse: 81  Temp: 98.2 F (36.8 C)  SpO2: 98%    BP  Readings from Last 3 Encounters:  04/14/23 118/74  11/08/22 106/63  10/03/22 124/80   Wt Readings from Last 3 Encounters:  04/14/23 186 lb 3.2 oz (84.5 kg)  11/08/22 181 lb (82.1 kg)  10/03/22 183 lb 12.8 oz (83.4 kg)    Physical Exam Constitutional:      General: She is not in acute distress.    Appearance: She is not diaphoretic.  HENT:     Head: Normocephalic and atraumatic.  Cardiovascular:     Rate and Rhythm: Normal rate and regular rhythm.     Heart sounds: Normal heart sounds.  Pulmonary:     Effort: Pulmonary effort is normal.     Breath sounds: Normal breath sounds.  Abdominal:     General: Bowel sounds are normal. There is no distension.     Palpations: Abdomen is soft.     Tenderness: There is no abdominal tenderness.  Musculoskeletal:     Right lower leg: No edema.     Left lower leg: No edema.  Lymphadenopathy:     Cervical: No cervical adenopathy.  Skin:    General: Skin is warm and dry.  Neurological:     Mental Status: She is alert.  Psychiatric:        Mood and Affect: Mood normal.      Assessment/Plan:   Routine general medical examination at a health care facility Assessment & Plan: Physical exam completed.  Encouraged healthy diet and exercise.  Discussed trying to add in exercise 2 to 3 days a week.  She will call to schedule her mammogram.  Pap smear and colonoscopy are up-to-date.  She will be given her flu vaccine today.  She declines further COVID vaccinations.  She is going to check with her insurance regarding coverage for the Shingrix vaccine.  Lab work as outlined.   Vitamin D deficiency disease -     VITAMIN D 25 Hydroxy (Vit-D Deficiency, Fractures)  Hyperlipidemia, unspecified hyperlipidemia type -     Comprehensive metabolic panel -     Lipid panel  Encounter for screening mammogram for malignant neoplasm of breast -     3D Screening Mammogram, Left and Right; Future  Hashimoto's disease -     TSH  Prediabetes -      Hemoglobin A1c    Return in about 6 months (around 10/13/2023) for Transfer of care.   [name], MD [facility]

## 2023-04-14 NOTE — Patient Instructions (Signed)
Nice to see you. Please call [phone] to schedule your mammogram. We will contact you with your lab results. Please try to add in exercise 2-3 times a week.

## 2023-04-22 ENCOUNTER — Other Ambulatory Visit: Payer: Self-pay | Admitting: Family Medicine

## 2023-10-19 ENCOUNTER — Ambulatory Visit

## 2023-10-19 VITALS — BP 120/70 | HR 90 | Temp 97.9°F | Ht 67.0 in | Wt 188.4 lb

## 2023-10-19 DIAGNOSIS — E782 Mixed hyperlipidemia: Secondary | ICD-10-CM

## 2023-10-19 DIAGNOSIS — F39 Unspecified mood [affective] disorder: Secondary | ICD-10-CM

## 2023-10-19 DIAGNOSIS — R5383 Other fatigue: Secondary | ICD-10-CM

## 2023-10-19 DIAGNOSIS — E063 Autoimmune thyroiditis: Secondary | ICD-10-CM

## 2023-10-19 DIAGNOSIS — R7303 Prediabetes: Secondary | ICD-10-CM | POA: Diagnosis not present

## 2023-10-19 DIAGNOSIS — E663 Overweight: Secondary | ICD-10-CM

## 2023-10-19 DIAGNOSIS — Z1231 Encounter for screening mammogram for malignant neoplasm of breast: Secondary | ICD-10-CM

## 2023-10-19 LAB — CBC WITH DIFFERENTIAL/PLATELET
Basophils Absolute: 0.1 10*3/uL (ref 0.0–0.1)
Basophils Relative: 1.1 % (ref 0.0–3.0)
Eosinophils Absolute: 0.1 10*3/uL (ref 0.0–0.7)
Eosinophils Relative: 0.8 % (ref 0.0–5.0)
HCT: 40.5 % (ref 36.0–46.0)
Hemoglobin: 13.4 g/dL (ref 12.0–15.0)
Lymphocytes Relative: 25.8 % (ref 12.0–46.0)
Lymphs Abs: 1.7 10*3/uL (ref 0.7–4.0)
MCHC: 33 g/dL (ref 30.0–36.0)
MCV: 91.1 fl (ref 78.0–100.0)
Monocytes Absolute: 0.6 10*3/uL (ref 0.1–1.0)
Monocytes Relative: 8.8 % (ref 3.0–12.0)
Neutro Abs: 4.2 10*3/uL (ref 1.4–7.7)
Neutrophils Relative %: 63.5 % (ref 43.0–77.0)
Platelets: 300 10*3/uL (ref 150.0–400.0)
RBC: 4.45 Mil/uL (ref 3.87–5.11)
RDW: 13.7 % (ref 11.5–15.5)
WBC: 6.6 10*3/uL (ref 4.0–10.5)

## 2023-10-19 LAB — LIPID PANEL
Cholesterol: 218 mg/dL — ABNORMAL HIGH (ref 0–200)
HDL: 46.5 mg/dL (ref >39.00)
LDL Cholesterol: 128 mg/dL — ABNORMAL HIGH (ref 0–99)
NonHDL: 171.79
Total CHOL/HDL Ratio: 5
Triglycerides: 221 mg/dL — ABNORMAL HIGH (ref 0.0–149.0)
VLDL: 44.2 mg/dL — ABNORMAL HIGH (ref 0.0–40.0)

## 2023-10-19 LAB — COMPREHENSIVE METABOLIC PANEL WITH GFR
ALT: 21 U/L (ref 0–35)
AST: 19 U/L (ref 0–37)
Albumin: 4.3 g/dL (ref 3.5–5.2)
Alkaline Phosphatase: 80 U/L (ref 39–117)
BUN: 11 mg/dL (ref 6–23)
CO2: 31 meq/L (ref 19–32)
Calcium: 9.2 mg/dL (ref 8.4–10.5)
Chloride: 102 meq/L (ref 96–112)
Creatinine, Ser: 1.09 mg/dL (ref 0.40–1.20)
GFR: 55.14 mL/min — ABNORMAL LOW (ref >60.00)
Glucose, Bld: 76 mg/dL (ref 70–99)
Potassium: 3.8 meq/L (ref 3.5–5.1)
Sodium: 139 meq/L (ref 135–145)
Total Bilirubin: 0.3 mg/dL (ref 0.2–1.2)
Total Protein: 7.2 g/dL (ref 6.0–8.3)

## 2023-10-19 LAB — VITAMIN D 25 HYDROXY (VIT D DEFICIENCY, FRACTURES): VITD: 37.51 ng/mL (ref 30.00–100.00)

## 2023-10-19 LAB — TSH: TSH: 2.6 u[IU]/mL (ref 0.35–5.50)

## 2023-10-19 LAB — HEMOGLOBIN A1C: Hgb A1c MFr Bld: 5.8 % (ref 4.6–6.5)

## 2023-10-19 LAB — VITAMIN B12: Vitamin B-12: 604 pg/mL (ref 211–911)

## 2023-10-19 MED ORDER — SERTRALINE HCL 100 MG PO TABS: 100.0000 mg | ORAL_TABLET | Freq: Every day | ORAL | 2 refills | Status: DC

## 2023-10-19 MED ORDER — BUPROPION HCL ER (SR) 100 MG PO TB12: 100.0000 mg | ORAL_TABLET | Freq: Every day | ORAL | 2 refills | Status: DC

## 2023-10-19 NOTE — Assessment & Plan Note (Signed)
 We will obtain CBC, vitamin d  level, vitamin b12, CMP to r/o reversible cause for fatigue. Management pending results. Recommend increasing daily moderate intensity exercise, healthy eating. We can consider evaluation for OSA if above labs come back normal.

## 2023-10-19 NOTE — Patient Instructions (Addendum)
 YOUR MAMMOGRAM IS DUE, PLEASE CALL AND GET THIS SCHEDULED! [facility] - call [phone]   Please reach out to your pharmacy to update on shingles vaccine.   We will reach out to you with your lab results.

## 2023-10-19 NOTE — Assessment & Plan Note (Signed)
 Screening mammogram ordered. Patient provided with phone number to schedule appointment for screening mammogram.

## 2023-10-19 NOTE — Assessment & Plan Note (Signed)
 Currently stable on Bupropion  100 mg daily and Sertraline  100 mg daily. Refill sent. Reviewed PHQ-9 and GAD-7 results today.

## 2023-10-19 NOTE — Assessment & Plan Note (Signed)
 Defer to plan to hyperlipidemia.

## 2023-10-19 NOTE — Assessment & Plan Note (Signed)
 No thyromegaly. Last TSH was within normal range. Given fatigue will check TSH today.

## 2023-10-19 NOTE — Assessment & Plan Note (Signed)
 ASCVD risk reviewed with the patient. I also reviewed her previous lipid panel. We will repeat lipid panel today, if LDL >190 or 10 years ASCVD risk >10% will start her on moderate intensity statin. She was also counseled on intensive lifestyle modification including eating healthy diet, regular moderate intensity exercise.

## 2023-10-19 NOTE — Progress Notes (Signed)
 Established Patient Office Visit   Subjective  Patient ID: Patty Young, female    DOB: June 04, 1961  Age: 63 y.o. MRN: [id number]  Chief Complaint  Patient presents with   Transitions Of Care    She  has a past medical history of CKD (chronic kidney disease) stage 2, GFR 60-89 ml/min, Depression, Hashimoto's disease, Hyperlipidemia, Migraines, Obesity, PONV (postoperative nausea and vomiting), Vitamin D  deficiency disease (08/26/2014), and Wears dentures. Established patient of Dr. Lovetta Rucks presenting for transfer of care.   HPI 1) H/O Migraine headache: Hammering, u/l headache occurring less than one month. Used to take Imitrex but has not needed any medication for this.   2) Has a h/o right breast biopsy in the past. Is due for screening mammogram.   3) Depression: Stable on Bupropion  100 mg once a day and Zoloft  100 mg daily. She was told she can take Bupropion  100 mg twice a day but her symptoms has been well controlled on once a day Bupropion . Denies SI/HI.   4) H/O cold sores: Takes Valtrex  prn. Does not need refill.   5) H/O Hashimoto's disease: Last TSH was normal. No constipation, dry skin.   6) Due for shingles immunization.   7) Fatigue: Patient reports of feeling tired, does not wake up refreshed in the morning. Has not changed from baseline. Currently she does not exercise on a regular basis. Eats mostly home made food. She reports she can cut down on daily sugar intake.   8) Mixed hyperlipidemia: Elevated LDL history with 10 years ASCVD risk of 3.5%.   9) Wears denture, sees dentist on a regular basis.   10) H/O vitamin D  deficiency: Takes daily multivitamin and vitamin D  supplement.   11) Prediabetes: Last A1c was 5.8% on 04/14/23.   ROS As per HPI    Objective:     BP 120/70   Pulse 90   Temp 97.9 F (36.6 C) (Oral)   Ht 5\' 7"  (1.702 m)   Wt 188 lb 6.4 oz (85.5 kg)   SpO2 96%   BMI 29.51 kg/m      10/19/2023   10:05 AM 04/14/2023    8:21 AM  10/03/2022    8:35 AM  Depression screen PHQ 2/9  Decreased Interest 1 0 0  Down, Depressed, Hopeless 0 0 0  PHQ - 2 Score 1 0 0  Altered sleeping 0 1 0  Tired, decreased energy 1 1 0  Change in appetite 0 0 0  Feeling bad or failure about yourself  0 0 0  Trouble concentrating 0 0 0  Moving slowly or fidgety/restless 0 0 0  Suicidal thoughts 0 0 0  PHQ-9 Score 2 2 0  Difficult doing work/chores Not difficult at all Somewhat difficult Not difficult at all      10/19/2023   10:05 AM 04/14/2023    8:21 AM 10/03/2022    8:35 AM 10/13/2016    8:28 AM  GAD 7 : Generalized Anxiety Score  Nervous, Anxious, on Edge 0 0 0 2  Control/stop worrying 0 0 0 2  Worry too much - different things 0 0 0 2  Trouble relaxing 0 0 0 1  Restless 0 0 0 0  Easily annoyed or irritable 1 1 0 3  Afraid - awful might happen 0 0 0 0  Total GAD 7 Score 1 1 0 10  Anxiety Difficulty Not difficult at all Somewhat difficult Not difficult at all     Physical Exam Constitutional:  Appearance: Normal appearance. She is normal weight.  HENT:     Head: Normocephalic and atraumatic.     Right Ear: Tympanic membrane normal.     Left Ear: Tympanic membrane normal.     Nose: No congestion.     Mouth/Throat:     Mouth: Mucous membranes are moist.  Cardiovascular:     Rate and Rhythm: Normal rate.  Pulmonary:     Effort: Pulmonary effort is normal.  Abdominal:     Palpations: Abdomen is soft.  Musculoskeletal:     Cervical back: Normal range of motion and neck supple. No tenderness.     Right lower leg: No edema.     Left lower leg: No edema.     Comments: Left lower leg with some varicose vein. Asymptomatic.   Lymphadenopathy:     Cervical: No cervical adenopathy.  Neurological:     Mental Status: She is alert and oriented to person, place, and time.  Psychiatric:        Mood and Affect: Mood normal.        No results found for any visits on 10/19/23.  The 10-year ASCVD risk score (Arnett DK,  et al., 2019) is: 3.5%    Assessment & Plan:  Mixed hyperlipidemia Assessment & Plan: ASCVD risk reviewed with the patient. I also reviewed her previous lipid panel. We will repeat lipid panel today, if LDL >190 or 10 years ASCVD risk >10% will start her on moderate intensity statin. She was also counseled on intensive lifestyle modification including eating healthy diet, regular moderate intensity exercise.   Orders: -     Lipid panel  Other fatigue Assessment & Plan: We will obtain CBC, vitamin d  level, vitamin b12, CMP to r/o reversible cause for fatigue. Management pending results. Recommend increasing daily moderate intensity exercise, healthy eating. We can consider evaluation for OSA if above labs come back normal.  Orders: -     Comprehensive metabolic panel with GFR -     CBC with Differential/Platelet -     VITAMIN D  25 Hydroxy (Vit-D Deficiency, Fractures) -     Vitamin B12  Hashimoto's disease Assessment & Plan: No thyromegaly. Last TSH was within normal range. Given fatigue will check TSH today.  Orders: -     TSH  Prediabetes Assessment & Plan: Check A1c today, counseled on cutting down on sugary, high carb food.   Orders: -     Hemoglobin A1c  Encounter for screening mammogram for malignant neoplasm of breast Assessment & Plan: Screening mammogram ordered. Patient provided with phone number to schedule appointment for screening mammogram.   Orders: -     3D Screening Mammogram, Left and Right; Future  Mood disorder (HCC) Assessment & Plan: Currently stable on Bupropion  100 mg daily and Sertraline  100 mg daily. Refill sent. Reviewed PHQ-9 and GAD-7 results today.   Overweight Assessment & Plan: Defer to plan to hyperlipidemia.    Other orders -     buPROPion  HCl ER (SR); Take 1 tablet (100 mg total) by mouth daily.  Dispense: 90 tablet; Refill: 2 -     Sertraline  HCl; Take 1 tablet (100 mg total) by mouth daily.  Dispense: 90 tablet; Refill:  2   Patient will check with her pharmacy to update on Shingles immunization. If left lower leg varicose vein becomes painful, or if patient notices u/l lower leg swelling she is counseled to seek immediate medical care.  Return in about 6 months (around 04/19/2024) for Chronic f/u .  Jacklin Mascot, MD

## 2023-10-19 NOTE — Assessment & Plan Note (Signed)
 Check A1c today, counseled on cutting down on sugary, high carb food.

## 2023-10-26 ENCOUNTER — Ambulatory Visit
Admission: RE | Admit: 2023-10-26 | Discharge: 2023-10-26 | Disposition: A | Discharge status: Home / Self Care | Source: Ambulatory Visit | Attending: Emergency Medicine | Admitting: Emergency Medicine

## 2023-10-26 VITALS — BP 131/85 | HR 86 | Temp 98.5°F | Resp 18

## 2023-10-26 DIAGNOSIS — L739 Follicular disorder, unspecified: Secondary | ICD-10-CM

## 2023-10-26 DIAGNOSIS — L0291 Cutaneous abscess, unspecified: Secondary | ICD-10-CM

## 2023-10-26 DIAGNOSIS — R220 Localized swelling, mass and lump, head: Secondary | ICD-10-CM

## 2023-10-26 MED ORDER — MUPIROCIN 2 % EX OINT: 1.0000 | TOPICAL_OINTMENT | Freq: Two times a day (BID) | CUTANEOUS | 0 refills | Status: AC

## 2023-10-26 MED ORDER — DOXYCYCLINE HYCLATE 100 MG PO CAPS: 100.0000 mg | ORAL_CAPSULE | Freq: Two times a day (BID) | ORAL | 0 refills | Status: AC

## 2023-10-26 NOTE — ED Triage Notes (Signed)
 Pt presents with an abscess on her right side jaw x 5 days. She has taken ibuprofen for the pain.

## 2023-10-26 NOTE — ED Provider Notes (Signed)
 [facility]    CSN: [id number] Arrival date & time: 10/26/23  0809      History   Chief Complaint Chief Complaint  Patient presents with   Abscess    I have a knot/swollen spot on my jawline. Painful and red. - Entered by patient    HPI Patty Young is a 63 y.o. female.   63 year old female, Patty Young, presents to urgent care for evaluation of abscess to right lower jaw for 5 days.  Patient that she has taken ibuprofen for the pain without relief  PMH: Chronic kidney disease stage II, Hashimoto, depression, hyperlipidemia, migraines, obesity  Patient has reported drug allergy to sulfa antibiotics  The history is provided by the patient. No language interpreter was used.    Past Medical History:  Diagnosis Date   CKD (chronic kidney disease) stage 2, GFR 60-89 ml/min    Depression    Hashimoto's disease    Hyperlipidemia    Migraines    approx 1x/month   Obesity    PONV (postoperative nausea and vomiting)    Vitamin D  deficiency disease 08/26/2014   11.7   Wears dentures    full upper and lower    Patient Active Problem List   Diagnosis Date Noted   Abscess 10/26/2023   Right facial swelling 10/26/2023   Folliculitis 10/26/2023   Prediabetes 10/19/2023   Routine general medical examination at a health care facility 04/14/2023   Encounter for screening colonoscopy 11/08/2022   Cervical cancer screening 04/01/2022   Cervical stenosis (uterine cervix) 04/01/2022   Fatigue 02/01/2022   Overweight 11/03/2020   History of COVID-19 11/03/2020   Bunion 05/05/2020   Acute right-sided thoracic back pain 11/08/2019   Restless legs 08/19/2019   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   Bright red blood per rectum 10/18/2018   Breast cancer screening 10/18/2018   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   Numbness and tingling in right hand 07/12/2017   Clavicular area fullness 05/16/2017   Heel pain 04/14/2017   Hypertrophy of right clavicle 04/14/2017    Seborrheic keratoses 04/16/2015   Mood disorder (HCC)    Hyperlipidemia    Migraines    Chronic kidney disease (CKD), stage III (moderate) (HCC)    Hashimoto's disease    Vitamin D  deficiency disease     Past Surgical History:  Procedure Laterality Date   ABDOMINAL HYSTERECTOMY  09/2005   still has ovaries and cervix, needs paps   BREAST BIOPSY Right 07/02/2020   rt breast stereo distortion ribbon clip path pending   COLONOSCOPY WITH PROPOFOL  N/A 11/08/2022   Procedure: COLONOSCOPY WITH PROPOFOL ;  Surgeon: Selena Daily, MD;  Location: [facility];  Service: Endoscopy;  Laterality: N/A;    OB History   No obstetric history on file.      Home Medications    Prior to Admission medications   Medication Sig Start Date End Date Taking? Authorizing Provider  doxycycline  (VIBRAMYCIN ) 100 MG capsule Take 1 capsule (100 mg total) by mouth 2 (two) times daily for 5 days. 10/26/23 10/31/23 Yes Celicia Minahan, Eveleen Hinds, NP  mupirocin  ointment (BACTROBAN ) 2 % Apply 1 Application topically 2 (two) times daily for 7 days. Face/neck 10/26/23 11/02/23 Yes Hennessy Bartel, Eveleen Hinds, NP  buPROPion  ER (WELLBUTRIN  SR) 100 MG 12 hr tablet Take 1 tablet (100 mg total) by mouth daily. 10/19/23   Bair, Kalpana, MD  Cholecalciferol (VITAMIN D3) 2000 units capsule Take 2,000 Units by mouth daily.    [provider]  Multiple Vitamins-Minerals (HAIR SKIN & NAILS PO) Take by mouth daily.    [provider]  Multiple Vitamins-Minerals (MULTIVITAMIN WOMEN 50+ PO) Take by mouth daily.    [provider]  Omega-3 Fatty Acids (FISH OIL) 1000 MG CAPS Take 1 capsule by mouth daily.    [provider]  sertraline  (ZOLOFT ) 100 MG tablet Take 1 tablet (100 mg total) by mouth daily. 10/19/23   Bair, Kalpana, MD  valACYclovir  (VALTREX ) 1000 MG tablet TAKE 2 TABLETS BY MOUTH AT 1ST SIGN OF OUTBREAK, THEN 2 MORE 12 HOURS LATER. (4 PER OUTBREAK) 02/13/20   Kent Pear, MD    Family  History Family History  Problem Relation Age of Onset   COPD Mother    Hypertension Mother    Osteoporosis Mother    Hypertension Father    Hyperlipidemia Brother     Social History Social History   Tobacco Use   Smoking status: Never   Smokeless tobacco: Never  Vaping Use   Vaping status: Never Used  Substance Use Topics   Alcohol use: No   Drug use: No     Allergies   Sulfa antibiotics   Review of Systems Review of Systems  HENT:  Positive for facial swelling.   All other systems reviewed and are negative.    Physical Exam Triage Vital Signs ED Triage Vitals [10/26/23 0822]  Encounter Vitals Group     BP      Systolic BP Percentile      Diastolic BP Percentile      Pulse      Resp      Temp      Temp src      SpO2      Weight      Height      Head Circumference      Peak Flow      Pain Score 8     Pain Loc      Pain Education      Exclude from Growth Chart    No data found.  Updated Vital Signs BP 131/85 (BP Location: Right Arm)   Pulse 86   Temp 98.5 F (36.9 C) (Oral)   Resp 18   SpO2 96%   Visual Acuity Right Eye Distance:   Left Eye Distance:   Bilateral Distance:    Right Eye Near:   Left Eye Near:    Bilateral Near:     Physical Exam   UC Treatments / Results  Labs (all labs ordered are listed, but only abnormal results are displayed) Labs Reviewed - No data to display  EKG   Radiology No results found.  Procedures Procedures (including critical care time)  Medications Ordered in UC Medications - No data to display  Initial Impression / Assessment and Plan / UC Course  I have reviewed the triage vital signs and the nursing notes.  Pertinent labs & imaging results that were available during my care of the patient were reviewed by me and considered in my medical decision making (see chart for details).    Discussed exam findings and plan of care with pt: avoid touching face,squeezing or picking the area, wash  hands before and after antibiotic ointment use. Take antibiotic as directed (doxycycline , allergic to sulfa). Avoid sun exposure as you may be more sensitive taking this antibiotic All antibiotics can cause stomach upset Drink plenty of water , may use active culture yogurt or probiotic while taking antibiotic to help with  any stomach discomfort.  Follow up with PCP next week, sooner if worse.  Patient verbalized understanding to this provider  Ddx: Abscess, cellulitis, folliculitis Final Clinical Impressions(s) / UC Diagnoses   Final diagnoses:  Abscess  Right facial swelling  Folliculitis     Discharge Instructions      avoid touching face,squeezing or picking the area, wash hands before and after antibiotic ointment use.Take antibiotic as directed(doxycycline ). Avoid sun exposure as you may be more sensitive taking this antibiotic All antibiotics can cause stomach upset Drink plenty of water , may use active culture yogurt or probiotic while taking antibiotic to help with any stomach discomfort.  Follow up with PCP next week, sooner if worse.     ED Prescriptions     Medication Sig Dispense Auth. Provider   doxycycline  (VIBRAMYCIN ) 100 MG capsule Take 1 capsule (100 mg total) by mouth 2 (two) times daily for 5 days. 10 capsule Shenicka Sunderlin, NP   mupirocin  ointment (BACTROBAN ) 2 % Apply 1 Application topically 2 (two) times daily for 7 days. Face/neck 14 g Norberto Wishon, NP      PDMP not reviewed this encounter.   Peter Brands, NP 10/26/23 [phone]

## 2023-10-26 NOTE — Discharge Instructions (Addendum)
 avoid touching face,squeezing or picking the area, wash hands before and after antibiotic ointment use.Take antibiotic as directed(doxycycline ). Avoid sun exposure as you may be more sensitive taking this antibiotic All antibiotics can cause stomach upset Drink plenty of water , may use active culture yogurt or probiotic while taking antibiotic to help with any stomach discomfort.  Follow up with PCP next week, sooner if worse.

## 2023-11-09 DIAGNOSIS — E782 Mixed hyperlipidemia: Secondary | ICD-10-CM

## 2023-11-09 MED ORDER — ROSUVASTATIN CALCIUM 10 MG PO TABS
10.0000 mg | ORAL_TABLET | Freq: Every day | ORAL | 0 refills | Status: DC
Start: 2023-11-09 — End: 2023-12-05

## 2023-11-15 ENCOUNTER — Other Ambulatory Visit: Payer: Self-pay

## 2023-11-15 DIAGNOSIS — R928 Other abnormal and inconclusive findings on diagnostic imaging of breast: Secondary | ICD-10-CM

## 2023-11-15 DIAGNOSIS — Z1231 Encounter for screening mammogram for malignant neoplasm of breast: Secondary | ICD-10-CM

## 2023-12-01 ENCOUNTER — Other Ambulatory Visit: Payer: Self-pay

## 2023-12-01 ENCOUNTER — Ambulatory Visit
Admission: RE | Admit: 2023-12-01 | Discharge: 2023-12-01 | Disposition: A | Discharge status: Home / Self Care | Source: Ambulatory Visit

## 2023-12-01 ENCOUNTER — Ambulatory Visit: Payer: Self-pay

## 2023-12-01 DIAGNOSIS — Z1231 Encounter for screening mammogram for malignant neoplasm of breast: Secondary | ICD-10-CM | POA: Diagnosis present

## 2023-12-01 DIAGNOSIS — R928 Other abnormal and inconclusive findings on diagnostic imaging of breast: Secondary | ICD-10-CM | POA: Diagnosis present

## 2023-12-01 DIAGNOSIS — E782 Mixed hyperlipidemia: Secondary | ICD-10-CM

## 2023-12-01 NOTE — Progress Notes (Signed)
 Normal mammogram.   Patty Mascot, MD

## 2023-12-12 ENCOUNTER — Ambulatory Visit: Payer: Self-pay

## 2023-12-12 ENCOUNTER — Ambulatory Visit (INDEPENDENT_AMBULATORY_CARE_PROVIDER_SITE_OTHER)

## 2023-12-12 VITALS — BP 118/78 | HR 88 | Temp 99.1°F | Ht 67.0 in | Wt 189.2 lb

## 2023-12-12 DIAGNOSIS — E782 Mixed hyperlipidemia: Secondary | ICD-10-CM | POA: Diagnosis not present

## 2023-12-12 DIAGNOSIS — N1831 Chronic kidney disease, stage 3a: Secondary | ICD-10-CM | POA: Diagnosis not present

## 2023-12-12 LAB — COMPREHENSIVE METABOLIC PANEL WITH GFR
ALT: 24 U/L (ref 0–35)
AST: 20 U/L (ref 0–37)
Albumin: 4.4 g/dL (ref 3.5–5.2)
Alkaline Phosphatase: 81 U/L (ref 39–117)
BUN: 12 mg/dL (ref 6–23)
CO2: 30 meq/L (ref 19–32)
Calcium: 9.6 mg/dL (ref 8.4–10.5)
Chloride: 103 meq/L (ref 96–112)
Creatinine, Ser: 1.05 mg/dL (ref 0.40–1.20)
GFR: 57.61 mL/min — ABNORMAL LOW (ref >60.00)
Glucose, Bld: 97 mg/dL (ref 70–99)
Potassium: 3.8 meq/L (ref 3.5–5.1)
Sodium: 140 meq/L (ref 135–145)
Total Bilirubin: 0.5 mg/dL (ref 0.2–1.2)
Total Protein: 7.6 g/dL (ref 6.0–8.3)

## 2023-12-12 LAB — LIPID PANEL
Cholesterol: 155 mg/dL (ref 0–200)
HDL: 52.1 mg/dL (ref >39.00)
LDL Cholesterol: 85 mg/dL (ref 0–99)
NonHDL: 102.55
Total CHOL/HDL Ratio: 3
Triglycerides: 88 mg/dL (ref 0.0–149.0)
VLDL: 17.6 mg/dL (ref 0.0–40.0)

## 2023-12-12 NOTE — Assessment & Plan Note (Signed)
 On Rosuvastatin  10 mg, tolerating well, continue.  Check CMP, lipid panel today.  Lifestyle modifications for managing hyperlipidemia include adopting a heart-healthy diet/mediterranean diet including incorporating fiber rich food, reducing saturated and trans fats, engaging in regular physical activity, maintaining a healthy weight discussed.

## 2023-12-12 NOTE — Assessment & Plan Note (Signed)
 Has been avoiding NSAIDs. Stable GFR, repeat CMP today.

## 2023-12-12 NOTE — Assessment & Plan Note (Deleted)
 Stable

## 2023-12-12 NOTE — Assessment & Plan Note (Signed)
 Currently stable on Bupropion  100 mg daily and Sertraline  100 mg daily. Continue.  Reviewed PHQ-9 and GAD-7 result from today.

## 2023-12-12 NOTE — Progress Notes (Signed)
 Established Patient Office Visit   Subjective  Patient ID: Patty Young, female    DOB: 08/13/1960  Age: 63 y.o. MRN: [id number]  Chief Complaint  Patient presents with   Hyperlipidemia    She  has a past medical history of Bright red blood per rectum (10/18/2018), CKD (chronic kidney disease) stage 2, GFR 60-89 ml/min, Depression, Fatigue (02/01/2022), Hashimoto's disease, Hyperlipidemia, Migraines, Obesity, PONV (postoperative nausea and vomiting), Vitamin D  deficiency disease (08/26/2014), and Wears dentures.  F/U hyperlipidemia: Was started on Rosuvastatin  10 mg after her office visit with me on  10/19/23. She is tolerating it well. Patient reports she can improve exercising and cutting down on drinking sweet tea. Her older sister had a stroke at the age of 23, is in rehabilitation which has been stressful to the patient. She doe snot drink alcohol. Denies abdominal pain.    ROS As per HPI    Objective:     BP 118/78   Pulse 88   Temp 99.1 F (37.3 C) (Oral)   Ht 5' 7 (1.702 m)   Wt 189 lb 3.2 oz (85.8 kg)   SpO2 98%   BMI 29.63 kg/m      12/12/2023    8:23 AM 10/19/2023   10:05 AM 04/14/2023    8:21 AM  Depression screen PHQ 2/9  Decreased Interest 0 1 0  Down, Depressed, Hopeless 0 0 0  PHQ - 2 Score 0 1 0  Altered sleeping 1 0 1  Tired, decreased energy 1 1 1   Change in appetite 0 0 0  Feeling bad or failure about yourself  0 0 0  Trouble concentrating 0 0 0  Moving slowly or fidgety/restless 0 0 0  Suicidal thoughts 0 0 0  PHQ-9 Score 2 2 2   Difficult doing work/chores Not difficult at all Not difficult at all Somewhat difficult      12/12/2023    8:23 AM 10/19/2023   10:05 AM 04/14/2023    8:21 AM 10/03/2022    8:35 AM  GAD 7 : Generalized Anxiety Score  Nervous, Anxious, on Edge 0 0 0 0  Control/stop worrying 0 0 0 0  Worry too much - different things 0 0 0 0  Trouble relaxing 0 0 0 0  Restless 0 0 0 0  Easily annoyed or irritable 0 1 1 0  Afraid  - awful might happen 0 0 0 0  Total GAD 7 Score 0 1 1 0  Anxiety Difficulty Not difficult at all Not difficult at all Somewhat difficult Not difficult at all      12/12/2023    8:23 AM 10/19/2023   10:05 AM 04/14/2023    8:21 AM  Depression screen PHQ 2/9  Decreased Interest 0 1 0  Down, Depressed, Hopeless 0 0 0  PHQ - 2 Score 0 1 0  Altered sleeping 1 0 1  Tired, decreased energy 1 1 1   Change in appetite 0 0 0  Feeling bad or failure about yourself  0 0 0  Trouble concentrating 0 0 0  Moving slowly or fidgety/restless 0 0 0  Suicidal thoughts 0 0 0  PHQ-9 Score 2 2 2   Difficult doing work/chores Not difficult at all Not difficult at all Somewhat difficult      12/12/2023    8:23 AM 10/19/2023   10:05 AM 04/14/2023    8:21 AM 10/03/2022    8:35 AM  GAD 7 : Generalized Anxiety Score  Nervous, Anxious, on  Edge 0 0 0 0  Control/stop worrying 0 0 0 0  Worry too much - different things 0 0 0 0  Trouble relaxing 0 0 0 0  Restless 0 0 0 0  Easily annoyed or irritable 0 1 1 0  Afraid - awful might happen 0 0 0 0  Total GAD 7 Score 0 1 1 0  Anxiety Difficulty Not difficult at all Not difficult at all Somewhat difficult Not difficult at all   SDOH Screenings   Food Insecurity: No Food Insecurity (10/17/2023)  Housing: Low Risk  (10/17/2023)  Transportation Needs: No Transportation Needs (10/17/2023)  Depression (PHQ2-9): Low Risk  (12/12/2023)  Financial Resource Strain: Low Risk  (10/17/2023)  Physical Activity: Insufficiently Active (10/17/2023)  Social Connections: Moderately Isolated (10/17/2023)  Stress: No Stress Concern Present (10/17/2023)  Tobacco Use: Low Risk  (12/12/2023)     Physical Exam Constitutional:      Appearance: Normal appearance.  HENT:     Head: Normocephalic and atraumatic.  Neck:     Thyroid : No thyroid  mass or thyroid  tenderness.   Cardiovascular:     Rate and Rhythm: Normal rate and regular rhythm.  Pulmonary:     Effort: Pulmonary effort is  normal.     Breath sounds: Normal breath sounds.  Abdominal:     General: Bowel sounds are normal.     Palpations: Abdomen is soft.     Tenderness: There is no abdominal tenderness. There is no guarding or rebound.   Musculoskeletal:     Cervical back: Neck supple. No rigidity.     Right lower leg: No edema.     Left lower leg: No edema.   Skin:    General: Skin is warm.   Neurological:     Mental Status: She is alert and oriented to person, place, and time.   Psychiatric:        Mood and Affect: Mood normal.        Behavior: Behavior normal.        No results found for any visits on 12/12/23.  The 10-year ASCVD risk score (Arnett DK, et al., 2019) is: 3.4%     Assessment & Plan:   Mixed hyperlipidemia Assessment & Plan: On Rosuvastatin  10 mg, tolerating well, continue.  Check CMP, lipid panel today.  Lifestyle modifications for managing hyperlipidemia include adopting a heart-healthy diet/mediterranean diet including incorporating fiber rich food, reducing saturated and trans fats, engaging in regular physical activity, maintaining a healthy weight discussed.   Orders: -     Comprehensive metabolic panel with GFR -     Lipid panel  Stage 3a chronic kidney disease (HCC) Assessment & Plan: Has been avoiding NSAIDs. Stable GFR, repeat CMP today.    I spent 35 minutes on the day of this face-to-face encounter reviewing the patient's chief complaints, HPI, family history, current medications, and reviewing the assessment and plan with the patient. This time also included counseling the patient on their health conditions and management options. Additionally, I spent time post-visit ordering and reviewing diagnostics and therapeutics with the patient.   Return in about 6 months (around 06/12/2024) for chronic.   Jacklin Mascot, MD

## 2024-04-23 ENCOUNTER — Ambulatory Visit

## 2024-06-09 ENCOUNTER — Other Ambulatory Visit: Payer: Self-pay

## 2024-06-09 DIAGNOSIS — E782 Mixed hyperlipidemia: Secondary | ICD-10-CM

## 2024-07-10 ENCOUNTER — Ambulatory Visit

## 2024-07-19 ENCOUNTER — Other Ambulatory Visit: Payer: Self-pay

## 2024-07-19 DIAGNOSIS — F39 Unspecified mood [affective] disorder: Secondary | ICD-10-CM

## 2024-07-21 NOTE — Telephone Encounter (Signed)
 1. Mood disorder (Primary) - sertraline  (ZOLOFT ) 100 MG tablet; TAKE 1 TABLET BY MOUTH EVERY DAY  Dispense: 90 tablet; Refill: 3  Annesha Delgreco, MD

## 2024-07-23 ENCOUNTER — Other Ambulatory Visit: Payer: Self-pay

## 2024-08-01 ENCOUNTER — Ambulatory Visit

## 2024-08-01 VITALS — BP 120/70 | HR 88 | Temp 98.1°F | Ht 67.0 in | Wt 186.0 lb

## 2024-08-01 DIAGNOSIS — R52 Pain, unspecified: Secondary | ICD-10-CM | POA: Insufficient documentation

## 2024-08-01 DIAGNOSIS — B001 Herpesviral vesicular dermatitis: Secondary | ICD-10-CM

## 2024-08-01 DIAGNOSIS — E063 Autoimmune thyroiditis: Secondary | ICD-10-CM

## 2024-08-01 DIAGNOSIS — E782 Mixed hyperlipidemia: Secondary | ICD-10-CM

## 2024-08-01 DIAGNOSIS — M79652 Pain in left thigh: Secondary | ICD-10-CM | POA: Insufficient documentation

## 2024-08-01 MED ORDER — VALACYCLOVIR HCL 1 G PO TABS: ORAL_TABLET | ORAL | 1 refills | Status: AC

## 2024-08-01 MED ORDER — BUPROPION HCL ER (SR) 100 MG PO TB12: 100.0000 mg | ORAL_TABLET | Freq: Every day | ORAL | 3 refills | Status: AC

## 2024-08-01 NOTE — Assessment & Plan Note (Signed)
 Managed with as needed Valtrex , refill sent. Orders:   valACYclovir  (VALTREX ) 1000 MG tablet; TAKE 2 TABLETS BY MOUTH AT 1ST SIGN OF OUTBREAK, THEN 2 MORE 12 HOURS LATER. (4 PER OUTBREAK)

## 2024-08-01 NOTE — Assessment & Plan Note (Addendum)
 Chronic, continue rosuvastatin  10 mg daily.  Check fasting lipid panel, CMP.  Future lab ordered. Orders:   Lipid panel; Future   Comp Met (CMET); Future

## 2024-08-01 NOTE — Patient Instructions (Addendum)
 A trial of diclofenac gel 1% 2-4 times a day as needed on left thigh to see if that helps with pain.

## 2024-08-01 NOTE — Assessment & Plan Note (Addendum)
 3 months h/o pain and paresthesia of left lateral, anterior thigh worsened by prolonged standing, laying weight on right side.  Differential diagnosis includes meralgia paresthetica, lumbar radiculopathy, femoral neuropathy, referred from left hip.   - Ordered x-ray of hip joint to r/o arthritis.  - Check vitamin B12 level to rule out vitamin B12 deficiency. - Refer to orthopedics if arthritis is present.  - Conservative management with diclofenac gel 2-4 times a day as needed, avoiding NSAIDs due to history of CKD.  Consider physical therapy, nerve conduction study if imaging reassuring and symptoms persist. Orders:   DG Arthro Hip Left; Future

## 2024-08-01 NOTE — Assessment & Plan Note (Addendum)
 TSH has been normal.  Not currently on medication.  Continue intermittent TSH monitoring.  Patient asymptomatic. Orders:   TSH; Future

## 2024-08-01 NOTE — Progress Notes (Signed)
 "  Established Patient Office Visit   Subjective  Patient ID: Patty Young, female    DOB: 1961-03-05  Age: 64 y.o. MRN: [id number]  Chief Complaint  Patient presents with   Medical Management of Chronic Issues    6 month follow up    Discussed the use of AI scribe software for clinical note transcription with the patient, who gave verbal consent to proceed.  History of Present Illness Patty Young is a 64 year old female who presents for chronic medication management. She also has numbness and tingling in her left anterior, lateral thigh since November. She has been experiencing a burning sensation and a feeling. The discomfort is significant enough to disrupt her sleep and is exacerbated by prolonged standing, laying on right side.  The symptoms have remained unchanged since onset about 3 months ago. Pain does not radiate to the groin. No recent trauma, back pain, urinary or stool incontinence, unintentional weight loss, fever, or chills.  Mood has been stable on sertraline  100 mg, bupropion  100 mg daily.  She has a history of vitamin D  deficiency and takes vitamin D  3 2000 units daily. She avoids ibuprofen due to past reduced kidney function.  She has been taking rosuvastatin  10 mg daily for cholesterol management.  She has a history of herpes labialis, treated with Valtrex  as needed . Has not needed treatment for few years now.   She has a history of Hashimoto thyroiditis with stable thyroid  hormone levels.  Denies dry skin, cold intolerance.    ROS As per HPI    Objective:     BP 120/70 (BP Location: Left Arm)   Pulse 88   Temp 98.1 F (36.7 C)   Ht 5' 7 (1.702 m)   Wt 186 lb (84.4 kg)   SpO2 96%   BMI 29.13 kg/m      08/01/2024    4:36 PM 12/12/2023    8:23 AM 10/19/2023   10:05 AM  Depression screen PHQ 2/9  Decreased Interest 0 0 1  Down, Depressed, Hopeless 0 0 0  PHQ - 2 Score 0 0 1  Altered sleeping 1 1 0  Tired, decreased energy 1 1 1   Change in appetite  0 0 0  Feeling bad or failure about yourself  0 0 0  Trouble concentrating 0 0 0  Moving slowly or fidgety/restless 0 0 0  Suicidal thoughts 0 0 0  PHQ-9 Score 2 2  2    Difficult doing work/chores Somewhat difficult Not difficult at all Not difficult at all     Data saved with a previous flowsheet row definition      08/01/2024    4:37 PM 12/12/2023    8:23 AM 10/19/2023   10:05 AM 04/14/2023    8:21 AM  GAD 7 : Generalized Anxiety Score  Nervous, Anxious, on Edge 0 0  0  0   Control/stop worrying 0 0  0  0   Worry too much - different things 1 0  0  0   Trouble relaxing 0 0  0  0   Restless 0 0  0  0   Easily annoyed or irritable 1 0  1  1   Afraid - awful might happen 0 0  0  0   Total GAD 7 Score 2 0 1 1  Anxiety Difficulty Not difficult at all Not difficult at all Not difficult at all Somewhat difficult     Data saved with a previous  flowsheet row definition      08/01/2024    4:36 PM 12/12/2023    8:23 AM 10/19/2023   10:05 AM  Depression screen PHQ 2/9  Decreased Interest 0 0 1  Down, Depressed, Hopeless 0 0 0  PHQ - 2 Score 0 0 1  Altered sleeping 1 1 0  Tired, decreased energy 1 1 1   Change in appetite 0 0 0  Feeling bad or failure about yourself  0 0 0  Trouble concentrating 0 0 0  Moving slowly or fidgety/restless 0 0 0  Suicidal thoughts 0 0 0  PHQ-9 Score 2 2  2    Difficult doing work/chores Somewhat difficult Not difficult at all Not difficult at all     Data saved with a previous flowsheet row definition      08/01/2024    4:37 PM 12/12/2023    8:23 AM 10/19/2023   10:05 AM 04/14/2023    8:21 AM  GAD 7 : Generalized Anxiety Score  Nervous, Anxious, on Edge 0 0  0  0   Control/stop worrying 0 0  0  0   Worry too much - different things 1 0  0  0   Trouble relaxing 0 0  0  0   Restless 0 0  0  0   Easily annoyed or irritable 1 0  1  1   Afraid - awful might happen 0 0  0  0   Total GAD 7 Score 2 0 1 1  Anxiety Difficulty Not difficult at all Not  difficult at all Not difficult at all Somewhat difficult     Data saved with a previous flowsheet row definition   SDOH Screenings   Food Insecurity: No Food Insecurity (07/08/2024)  Housing: Low Risk (07/08/2024)  Transportation Needs: No Transportation Needs (07/08/2024)  Depression (PHQ2-9): Low Risk (08/01/2024)  Financial Resource Strain: Medium Risk (07/08/2024)  Physical Activity: Inactive (07/08/2024)  Social Connections: Moderately Isolated (07/08/2024)  Stress: No Stress Concern Present (07/08/2024)  Tobacco Use: Low Risk (08/01/2024)     Physical Exam Constitutional:      General: She is not in acute distress. HENT:     Head: Normocephalic and atraumatic.     Mouth/Throat:     Mouth: Mucous membranes are moist.  Cardiovascular:     Rate and Rhythm: Normal rate.  Pulmonary:     Effort: Pulmonary effort is normal.     Breath sounds: Normal breath sounds.  Abdominal:     General: Bowel sounds are normal.     Palpations: Abdomen is soft.     Tenderness: There is no abdominal tenderness. There is no guarding.  Musculoskeletal:     Cervical back: Neck supple. No rigidity.     Lumbar back: Negative right straight leg raise test and negative left straight leg raise test.     Right hip: No lacerations or crepitus. Normal range of motion. Normal strength.     Left hip: No lacerations. Normal range of motion. Normal strength.     Right lower leg: No edema.     Left lower leg: No edema.     Comments: Left lateral thigh-focal tenderness to palpation over anterior lateral thigh approximately midway between the hip and knee, no obvious swelling, erythema, mass.  Range of motion, strength of both hip normal.   Lymphadenopathy:     Cervical: No cervical adenopathy.  Skin:    General: Skin is warm.  Neurological:     Mental Status:  She is alert and oriented to person, place, and time.  Psychiatric:        Mood and Affect: Mood normal.        Behavior: Behavior normal.        No  results found for any visits on 08/01/24.  The 10-year ASCVD risk score (Arnett DK, et al., 2019) is: 2.8%     Assessment & Plan:   Assessment & Plan Musculoskeletal pain of left thigh 3 months h/o pain and paresthesia of left lateral, anterior thigh worsened by prolonged standing, laying weight on right side.  Differential diagnosis includes meralgia paresthetica, lumbar radiculopathy, femoral neuropathy, referred from left hip.   - Ordered x-ray of hip joint to r/o arthritis.  - Check vitamin B12 level to rule out vitamin B12 deficiency. - Refer to orthopedics if arthritis is present.  - Conservative management with diclofenac gel 2-4 times a day as needed, avoiding NSAIDs due to history of CKD.  Consider physical therapy, nerve conduction study if imaging reassuring and symptoms persist. Orders:   DG Arthro Hip Left; Future  Mixed hyperlipidemia Chronic, continue rosuvastatin  10 mg daily.  Check fasting lipid panel, CMP.  Future lab ordered. Orders:   Lipid panel; Future   Comp Met (CMET); Future  Hashimoto's disease TSH has been normal.  Not currently on medication.  Continue intermittent TSH monitoring.  Patient asymptomatic. Orders:   TSH; Future  Tingling pain Plan per musculoskeletal pain of left thigh. Orders:   DG Arthro Hip Left; Future   B12; Future  Recurrent cold sores Managed with as needed Valtrex , refill sent. Orders:   valACYclovir  (VALTREX ) 1000 MG tablet; TAKE 2 TABLETS BY MOUTH AT 1ST SIGN OF OUTBREAK, THEN 2 MORE 12 HOURS LATER. (4 PER OUTBREAK)   I personally spent a total of 40 minutes in the care of the patient today including preparing to see the patient, getting/reviewing separately obtained history, performing a medically appropriate exam/evaluation, counseling and educating, placing orders, documenting clinical information in the EHR, independently interpreting results, communicating results, and coordinating care.  Return in about 3 months  (around 10/29/2024) for f/u with Dr. Abbey in 3 months. Fasting labs at patient's convinience .   Luke Abbey, MD "

## 2024-08-01 NOTE — Assessment & Plan Note (Addendum)
 Plan per musculoskeletal pain of left thigh. Orders:   DG Arthro Hip Left; Future   B12; Future

## 2024-08-13 ENCOUNTER — Other Ambulatory Visit
